# Patient Record
Sex: Female | Born: 1975 | Race: White | Hispanic: No | Marital: Married | Smoking: Never smoker
Health system: Southern US, Community
[De-identification: ages and names within clinical notes are randomized; demographics above are authoritative.]

## PROBLEM LIST (undated history)

## (undated) ENCOUNTER — Inpatient Hospital Stay (HOSPITAL_COMMUNITY): Payer: Self-pay

## (undated) DIAGNOSIS — J45909 Unspecified asthma, uncomplicated: Secondary | ICD-10-CM

## (undated) DIAGNOSIS — Z8619 Personal history of other infectious and parasitic diseases: Secondary | ICD-10-CM

## (undated) DIAGNOSIS — IMO0002 Reserved for concepts with insufficient information to code with codable children: Secondary | ICD-10-CM

## (undated) DIAGNOSIS — R87619 Unspecified abnormal cytological findings in specimens from cervix uteri: Secondary | ICD-10-CM

## (undated) HISTORY — DX: Reserved for concepts with insufficient information to code with codable children: IMO0002

## (undated) HISTORY — DX: Unspecified abnormal cytological findings in specimens from cervix uteri: R87.619

## (undated) HISTORY — PX: DILATION AND CURETTAGE OF UTERUS: SHX78

## (undated) HISTORY — DX: Personal history of other infectious and parasitic diseases: Z86.19

## (undated) HISTORY — DX: Unspecified asthma, uncomplicated: J45.909

## (undated) HISTORY — PX: COLPOSCOPY: SHX161

---

## 2008-09-19 ENCOUNTER — Ambulatory Visit: Payer: Self-pay | Admitting: Gastroenterology

## 2008-09-19 LAB — CONVERTED CEMR LAB
Calcium: 9.5 mg/dL (ref 8.4–10.5)
Creatinine, Ser: 0.7 mg/dL (ref 0.4–1.2)
Sodium: 141 meq/L (ref 135–145)

## 2008-10-07 ENCOUNTER — Ambulatory Visit: Payer: Self-pay | Admitting: Internal Medicine

## 2009-01-18 ENCOUNTER — Telehealth: Payer: Self-pay | Admitting: Internal Medicine

## 2009-12-06 ENCOUNTER — Inpatient Hospital Stay (HOSPITAL_COMMUNITY): Admission: AD | Admit: 2009-12-06 | Discharge: 2009-12-06 | Payer: Self-pay | Admitting: Obstetrics and Gynecology

## 2009-12-15 ENCOUNTER — Inpatient Hospital Stay (HOSPITAL_COMMUNITY): Admission: AD | Admit: 2009-12-15 | Discharge: 2009-12-18 | Payer: Self-pay | Admitting: Obstetrics & Gynecology

## 2010-06-21 LAB — CBC
HCT: 26.4 % — ABNORMAL LOW (ref 36.0–46.0)
MCH: 29.3 pg (ref 26.0–34.0)
MCH: 29.7 pg (ref 26.0–34.0)
MCHC: 33.4 g/dL (ref 30.0–36.0)
MCHC: 33.5 g/dL (ref 30.0–36.0)
MCV: 87.5 fL (ref 78.0–100.0)
MCV: 89 fL (ref 78.0–100.0)
Platelets: 149 10*3/uL — ABNORMAL LOW (ref 150–400)
RBC: 3.59 MIL/uL — ABNORMAL LOW (ref 3.87–5.11)
RDW: 13 % (ref 11.5–15.5)
RDW: 13.3 % (ref 11.5–15.5)
WBC: 9.2 10*3/uL (ref 4.0–10.5)

## 2010-08-31 ENCOUNTER — Ambulatory Visit (INDEPENDENT_AMBULATORY_CARE_PROVIDER_SITE_OTHER): Payer: 59 | Admitting: Internal Medicine

## 2010-08-31 ENCOUNTER — Encounter: Payer: Self-pay | Admitting: Internal Medicine

## 2010-08-31 VITALS — BP 122/78 | HR 79 | Temp 98.3°F | Wt 155.0 lb

## 2010-08-31 DIAGNOSIS — J069 Acute upper respiratory infection, unspecified: Secondary | ICD-10-CM

## 2010-09-03 NOTE — Progress Notes (Signed)
  Subjective:    Patient ID: Tiffany Henderson, female    DOB: 03/04/1976, 35 y.o.   MRN: [id number]  HPI Ms. Edberg presents with a h/o URI symptoms: cough, chest congestion, mild sore throat. She denis any hearing loss, otalgia, fevers or rigors, N/V/D. She has not tried any otc medications. She has had no sick contacts no travel history.    Past Medical History: chicken pox as a child  Past Surgical History: D&C '04  No pregnancy  Family History: father- '59: back problems mother -'66: breast cancer '04 - survivor, HTN Neg- colon cancer, CAD  Social History: [employer] college married '00 - 4 years/divorced; married  '10 no children  work: Equities trader no abuse history.        Review of Systems Review of Systems  Constitutional:  Negative for fever, chills, activity change and unexpected weight change.  HENT:  Negative for hearing loss, ear pain, congestion, neck stiffness. Positive for postnasal drip.   Eyes: Negative for pain, discharge and visual disturbance.  Respiratory: Negative for chest tightness and wheezing.   Cardiovascular: Negative for chest pain and palpitations.      No decreased exercise tolerance Gastrointestinal: No change in bowel habit. No bloating or gas. No reflux or indigestion Genitourinary: Negative for urgency, frequency, flank pain and difficulty urinating.  Musculoskeletal: Negative for myalgias, back pain, arthralgias and gait problem.  Neurological: Negative for dizziness, tremors, weakness and headaches.  Hematological: Negative for adenopathy.  Psychiatric/Behavioral: Negative for behavioral problems and dysphoric mood.       Objective:   Physical Exam Vitals reviewed. HEENT - Accord/AT, EACs/TMs normal without erythema, bulging or loss of landmarks. Neck- supple Chest - CTAP COR- RRR       Assessment & Plan:  URI - patient with symptoms c/w viral URI with no evidence of acute bacterial infection.  Plan -  no lab indicated.           Supportive care with OTC anti-tussive, APAP, hydrate, rest.

## 2010-09-04 ENCOUNTER — Telehealth: Payer: Self-pay | Admitting: *Deleted

## 2010-09-04 NOTE — Telephone Encounter (Signed)
Fever?, swollen glands? Offer rapid strep without OV for morning. If need be I can take a quick look.

## 2010-09-04 NOTE — Telephone Encounter (Signed)
Patient requesting rx for zpak. She continues to have very raw sore throat.

## 2010-09-04 NOTE — Telephone Encounter (Signed)
Left detailed VM for patient. Advised her to call and ask to speak with Ami or myself for quick strep screen.

## 2010-09-05 NOTE — Telephone Encounter (Signed)
Called pt and she states her symptoms are much better today. Will call prn

## 2011-01-03 ENCOUNTER — Telehealth: Payer: Self-pay | Admitting: *Deleted

## 2011-01-03 ENCOUNTER — Encounter: Payer: Self-pay | Admitting: Internal Medicine

## 2011-01-03 NOTE — Telephone Encounter (Signed)
Spoke with patient - she c/o palpitations, "racing" heart & slight SOB off and on x 2 days. Episodes last only a few seconds. She is taking no medication, no supplements etc. No CP. Scheduled for OV tomorrow AM, MD aware. Pt advised to go to ER w/severe symptoms.

## 2011-01-04 ENCOUNTER — Ambulatory Visit (INDEPENDENT_AMBULATORY_CARE_PROVIDER_SITE_OTHER): Payer: 59 | Admitting: Internal Medicine

## 2011-01-04 DIAGNOSIS — R079 Chest pain, unspecified: Secondary | ICD-10-CM

## 2011-01-04 DIAGNOSIS — R51 Headache: Secondary | ICD-10-CM

## 2011-01-04 DIAGNOSIS — R002 Palpitations: Secondary | ICD-10-CM

## 2011-01-06 DIAGNOSIS — R002 Palpitations: Secondary | ICD-10-CM | POA: Insufficient documentation

## 2011-01-06 DIAGNOSIS — R519 Headache, unspecified: Secondary | ICD-10-CM | POA: Insufficient documentation

## 2011-01-06 DIAGNOSIS — R51 Headache: Secondary | ICD-10-CM | POA: Insufficient documentation

## 2011-01-06 NOTE — Progress Notes (Signed)
  Subjective:    Patient ID: Tiffany Henderson, female    DOB: 15-Nov-1975, 35 y.o.   MRN: [id number]  HPI Ms. Peak presents for evaluation of palpitations. She reports that she will have episodes of "heart racing' stating 3 days ago. These episodes began spontaneously, are not associated with activity or exertion and are associated with only minimal symptoms of slight light-headedness but no syncope or near syncope, no chest pain, no SOB, no change in vision. She reports no change in caffeine intake which is two sodas a day. No other identified precipitating factors.   She does have chronic headaches - averaging at least two per week. She will have photophobia, mild nausea. She denies any associated focal neurologic symptoms. She did get relief with excedrin Headache formula but has not tried any other headache medications. She gets no relief from NSAIDs. Duration can be up to one day.   I have reviewed the patient's medical history in detail and updated the computerized patient record..   Review of Systems Constitutional:  Negative for fever, chills, activity change and unexpected weight change.  HEENT:  Negative for hearing loss, ear pain, congestion, neck stiffness and postnasal drip. Negative for sore throat or swallowing problems. Negative for dental complaints.   Eyes: Negative for vision loss or change in visual acuity.  Respiratory: Negative for chest tightness and wheezing. Negative for DOE.   Cardiovascular: Negative for chest pain or palpitations. No decreased exercise tolerance Gastrointestinal: No change in bowel habit. No bloating or gas. No reflux or indigestion Genitourinary: Negative for urgency, frequency, flank pain and difficulty urinating.  Musculoskeletal: Negative for myalgias, back pain, arthralgias and gait problem.  Neurological: Negative for dizziness, tremors, weakness and headaches.  Hematological: Negative for adenopathy.  Psychiatric/Behavioral: Negative for  behavioral problems and dysphoric mood.       Objective:   Physical Exam Vitals reviewed and normal.  Gen'l - WNWD white woman in no distress HEENT - C&S clear, PERRLA Neck - supple Pulm - normal respirations Cor - 2+ radial pulse, RRR no murmurs, rubs or gallops appreciated in both sitting and supine position.  12 lead EKG sinus arrythmia with normal axis and intervals with no ischemic changes (asymptomatic at time of EKG)       Assessment & Plan:

## 2011-01-06 NOTE — Assessment & Plan Note (Signed)
Patient reports chronic headaches - two or more per week that can last all day, have light sensitivity associated with events and require sleep to resolve. No associated neurologic symptoms. Distribution is frontal-parietal. Suspect atypical migraine, tension headache less likely with lack of response to NSAIDs.  Plan - trial of Maxalt MLT           For persistent symptoms may benefit from Headache evaluation by Dr. Clarisse Gouge.

## 2011-01-06 NOTE — Assessment & Plan Note (Signed)
Patient with a barrage of palpitation episodes over the past three days. Exam and EKG normal. Suspect PSVT.  Plan - patient to check pulse at next episode to determine regular vs irregular rhythm and to estimate rate           Instructed in Valsalva maneuver and carotid massage.            Reassured of negative exam and low risk of dangerous arrythmias based on history and exam.           For persistent symptoms will consider Holter monitoring.

## 2011-04-09 NOTE — L&D Delivery Note (Signed)
Delivery Note At 1:03 PM a viable and healthy female was delivered via Vaginal, Spontaneous Delivery (Presentation: Left Occiput Anterior).  APGAR: 9, 9; weight Pending.   Placenta status: Intact, Spontaneous.  Cord: 3 vessels with a loose shoulder cord that was reduced after delivery  Anesthesia: Epidural, 10cc 1% lidocaine Episiotomy: None Lacerations: 2nd degree Suture Repair: 3.0 vicryl Est. Blood Loss (mL): 300cc  Mom to postpartum.  Baby to nursery-stable.  Brilynn Biasi H. 01/16/2012, 1:21 PM

## 2011-06-05 LAB — OB RESULTS CONSOLE ABO/RH: RH Type: POSITIVE

## 2011-06-05 LAB — OB RESULTS CONSOLE GC/CHLAMYDIA: Chlamydia: NEGATIVE

## 2011-06-05 LAB — OB RESULTS CONSOLE ANTIBODY SCREEN: Antibody Screen: NEGATIVE

## 2011-06-05 LAB — OB RESULTS CONSOLE HEPATITIS B SURFACE ANTIGEN: Hepatitis B Surface Ag: NEGATIVE

## 2011-12-30 ENCOUNTER — Encounter (HOSPITAL_COMMUNITY): Payer: Self-pay | Admitting: *Deleted

## 2011-12-30 ENCOUNTER — Inpatient Hospital Stay (HOSPITAL_COMMUNITY)
Admission: AD | Admit: 2011-12-30 | Discharge: 2011-12-30 | Disposition: A | Discharge status: Home / Self Care | Payer: 59 | Source: Ambulatory Visit | Attending: Obstetrics and Gynecology | Admitting: Obstetrics and Gynecology

## 2011-12-30 DIAGNOSIS — O479 False labor, unspecified: Secondary | ICD-10-CM | POA: Insufficient documentation

## 2011-12-30 NOTE — MAU Note (Signed)
Was 3 cm on Fri.

## 2011-12-30 NOTE — MAU Note (Signed)
Hx of braxton hicks past couple wks.  Started cramping more this after noon, now every 8-9 min, low abd and low back shooting into pelvis.

## 2011-12-30 NOTE — MAU Note (Signed)
Pt presents for contractions that started after lunch and are approx. 8-88minutes.  Denies any LOF or bleeding.  Reports good fetal movement.

## 2012-01-13 ENCOUNTER — Encounter (HOSPITAL_COMMUNITY): Payer: Self-pay | Admitting: *Deleted

## 2012-01-13 ENCOUNTER — Telehealth (HOSPITAL_COMMUNITY): Payer: Self-pay | Admitting: *Deleted

## 2012-01-13 NOTE — Telephone Encounter (Signed)
Preadmission screen  

## 2012-01-16 ENCOUNTER — Inpatient Hospital Stay (HOSPITAL_COMMUNITY): Payer: 59 | Admitting: Anesthesiology

## 2012-01-16 ENCOUNTER — Encounter (HOSPITAL_COMMUNITY): Payer: Self-pay | Admitting: Anesthesiology

## 2012-01-16 ENCOUNTER — Encounter (HOSPITAL_COMMUNITY): Payer: Self-pay

## 2012-01-16 ENCOUNTER — Inpatient Hospital Stay (HOSPITAL_COMMUNITY)
Admission: RE | Admit: 2012-01-16 | Discharge: 2012-01-18 | DRG: 775 | Disposition: A | Discharge status: Home / Self Care | Payer: 59 | Source: Ambulatory Visit | Attending: Obstetrics and Gynecology | Admitting: Obstetrics and Gynecology

## 2012-01-16 LAB — CBC
HCT: 30 % — ABNORMAL LOW (ref 36.0–46.0)
Hemoglobin: 9.9 g/dL — ABNORMAL LOW (ref 12.0–15.0)
WBC: 11 10*3/uL — ABNORMAL HIGH (ref 4.0–10.5)

## 2012-01-16 LAB — TYPE AND SCREEN
ABO/RH(D): B POS
Antibody Screen: NEGATIVE

## 2012-01-16 LAB — ABO/RH: ABO/RH(D): B POS

## 2012-01-16 MED ORDER — DIPHENHYDRAMINE HCL 50 MG/ML IJ SOLN: 12.5000 mg | INTRAMUSCULAR | Status: DC | PRN

## 2012-01-16 MED ORDER — PRENATAL MULTIVITAMIN CH
1.0000 | ORAL_TABLET | Freq: Every day | ORAL | Status: DC
  Administered 2012-01-17: 1 via ORAL
  Filled 2012-01-16: qty 1

## 2012-01-16 MED ORDER — LACTATED RINGERS IV SOLN: 500.0000 mL | Freq: Once | INTRAVENOUS | Status: DC

## 2012-01-16 MED ORDER — IBUPROFEN 600 MG PO TABS
600.0000 mg | ORAL_TABLET | Freq: Four times a day (QID) | ORAL | Status: DC | PRN
  Administered 2012-01-16: 600 mg via ORAL
  Filled 2012-01-16: qty 1

## 2012-01-16 MED ORDER — FENTANYL 2.5 MCG/ML BUPIVACAINE 1/10 % EPIDURAL INFUSION (WH - ANES)
INTRAMUSCULAR | Status: DC | PRN
  Administered 2012-01-16: 14 mL/h via EPIDURAL

## 2012-01-16 MED ORDER — SODIUM CHLORIDE 0.9 % IV SOLN: 250.0000 mL | INTRAVENOUS | Status: DC | PRN

## 2012-01-16 MED ORDER — SODIUM CHLORIDE 0.9 % IJ SOLN: 3.0000 mL | Freq: Two times a day (BID) | INTRAMUSCULAR | Status: DC

## 2012-01-16 MED ORDER — IBUPROFEN 600 MG PO TABS
600.0000 mg | ORAL_TABLET | Freq: Four times a day (QID) | ORAL | Status: DC
  Administered 2012-01-16 – 2012-01-18 (×6): 600 mg via ORAL
  Filled 2012-01-16 (×6): qty 1

## 2012-01-16 MED ORDER — ONDANSETRON HCL 4 MG/2ML IJ SOLN: 4.0000 mg | INTRAMUSCULAR | Status: DC | PRN

## 2012-01-16 MED ORDER — DIBUCAINE 1 % RE OINT
1.0000 "application " | TOPICAL_OINTMENT | RECTAL | Status: DC | PRN
  Filled 2012-01-16: qty 28

## 2012-01-16 MED ORDER — OXYCODONE-ACETAMINOPHEN 5-325 MG PO TABS: 1.0000 | ORAL_TABLET | ORAL | Status: DC | PRN

## 2012-01-16 MED ORDER — CITRIC ACID-SODIUM CITRATE 334-500 MG/5ML PO SOLN: 30.0000 mL | ORAL | Status: DC | PRN

## 2012-01-16 MED ORDER — INFLUENZA VIRUS VACC SPLIT PF IM SUSP: 0.5000 mL | INTRAMUSCULAR | Status: DC

## 2012-01-16 MED ORDER — EPHEDRINE 5 MG/ML INJ
10.0000 mg | INTRAVENOUS | Status: DC | PRN
  Filled 2012-01-16: qty 4

## 2012-01-16 MED ORDER — OXYTOCIN 40 UNITS IN LACTATED RINGERS INFUSION - SIMPLE MED: 62.5000 mL/h | Freq: Once | INTRAVENOUS | Status: DC

## 2012-01-16 MED ORDER — OXYTOCIN BOLUS FROM INFUSION
500.0000 mL | Freq: Once | INTRAVENOUS | Status: DC
  Filled 2012-01-16: qty 500

## 2012-01-16 MED ORDER — LANOLIN HYDROUS EX OINT: TOPICAL_OINTMENT | CUTANEOUS | Status: DC | PRN

## 2012-01-16 MED ORDER — LIDOCAINE HCL (PF) 1 % IJ SOLN
30.0000 mL | INTRAMUSCULAR | Status: DC | PRN
  Administered 2012-01-16: 30 mL via SUBCUTANEOUS
  Filled 2012-01-16: qty 30

## 2012-01-16 MED ORDER — TERBUTALINE SULFATE 1 MG/ML IJ SOLN: 0.2500 mg | Freq: Once | INTRAMUSCULAR | Status: DC | PRN

## 2012-01-16 MED ORDER — OXYTOCIN 40 UNITS IN LACTATED RINGERS INFUSION - SIMPLE MED
1.0000 m[IU]/min | INTRAVENOUS | Status: DC
  Administered 2012-01-16: 4 m[IU]/min via INTRAVENOUS
  Administered 2012-01-16: 300 m[IU]/min via INTRAVENOUS
  Administered 2012-01-16: 2 m[IU]/min via INTRAVENOUS
  Filled 2012-01-16: qty 1000

## 2012-01-16 MED ORDER — ONDANSETRON HCL 4 MG PO TABS: 4.0000 mg | ORAL_TABLET | ORAL | Status: DC | PRN

## 2012-01-16 MED ORDER — ZOLPIDEM TARTRATE 5 MG PO TABS: 5.0000 mg | ORAL_TABLET | Freq: Every evening | ORAL | Status: DC | PRN

## 2012-01-16 MED ORDER — TETANUS-DIPHTH-ACELL PERTUSSIS 5-2.5-18.5 LF-MCG/0.5 IM SUSP: 0.5000 mL | Freq: Once | INTRAMUSCULAR | Status: DC

## 2012-01-16 MED ORDER — SODIUM CHLORIDE 0.9 % IJ SOLN: 3.0000 mL | INTRAMUSCULAR | Status: DC | PRN

## 2012-01-16 MED ORDER — LACTATED RINGERS IV SOLN
500.0000 mL | INTRAVENOUS | Status: DC | PRN
  Administered 2012-01-16: 500 mL via INTRAVENOUS

## 2012-01-16 MED ORDER — LACTATED RINGERS IV SOLN
INTRAVENOUS | Status: DC
  Administered 2012-01-16: 125 mL/h via INTRAVENOUS

## 2012-01-16 MED ORDER — OXYCODONE-ACETAMINOPHEN 5-325 MG PO TABS
1.0000 | ORAL_TABLET | ORAL | Status: DC | PRN
  Administered 2012-01-16 – 2012-01-18 (×6): 1 via ORAL
  Filled 2012-01-16 (×6): qty 1

## 2012-01-16 MED ORDER — ACETAMINOPHEN 325 MG PO TABS: 650.0000 mg | ORAL_TABLET | ORAL | Status: DC | PRN

## 2012-01-16 MED ORDER — WITCH HAZEL-GLYCERIN EX PADS: 1.0000 "application " | MEDICATED_PAD | CUTANEOUS | Status: DC | PRN

## 2012-01-16 MED ORDER — ONDANSETRON HCL 4 MG/2ML IJ SOLN: 4.0000 mg | Freq: Four times a day (QID) | INTRAMUSCULAR | Status: DC | PRN

## 2012-01-16 MED ORDER — BENZOCAINE-MENTHOL 20-0.5 % EX AERO
1.0000 "application " | INHALATION_SPRAY | CUTANEOUS | Status: DC | PRN
  Administered 2012-01-16: 1 via TOPICAL
  Filled 2012-01-16: qty 56

## 2012-01-16 MED ORDER — SENNOSIDES-DOCUSATE SODIUM 8.6-50 MG PO TABS
2.0000 | ORAL_TABLET | Freq: Every day | ORAL | Status: DC
  Administered 2012-01-16: 2 via ORAL

## 2012-01-16 MED ORDER — EPHEDRINE 5 MG/ML INJ: 10.0000 mg | INTRAVENOUS | Status: DC | PRN

## 2012-01-16 MED ORDER — DIPHENHYDRAMINE HCL 25 MG PO CAPS: 25.0000 mg | ORAL_CAPSULE | Freq: Four times a day (QID) | ORAL | Status: DC | PRN

## 2012-01-16 MED ORDER — LIDOCAINE HCL (PF) 1 % IJ SOLN
INTRAMUSCULAR | Status: DC | PRN
  Administered 2012-01-16 (×2): 9 mL

## 2012-01-16 MED ORDER — BUTORPHANOL TARTRATE 1 MG/ML IJ SOLN
1.0000 mg | INTRAMUSCULAR | Status: DC | PRN
Start: 2012-01-16 — End: 2012-01-16

## 2012-01-16 MED ORDER — PHENYLEPHRINE 40 MCG/ML (10ML) SYRINGE FOR IV PUSH (FOR BLOOD PRESSURE SUPPORT)
80.0000 ug | PREFILLED_SYRINGE | INTRAVENOUS | Status: DC | PRN
  Filled 2012-01-16: qty 5

## 2012-01-16 MED ORDER — PHENYLEPHRINE 40 MCG/ML (10ML) SYRINGE FOR IV PUSH (FOR BLOOD PRESSURE SUPPORT): 80.0000 ug | PREFILLED_SYRINGE | INTRAVENOUS | Status: DC | PRN

## 2012-01-16 MED ORDER — SIMETHICONE 80 MG PO CHEW: 80.0000 mg | CHEWABLE_TABLET | ORAL | Status: DC | PRN

## 2012-01-16 MED ORDER — FENTANYL 2.5 MCG/ML BUPIVACAINE 1/10 % EPIDURAL INFUSION (WH - ANES)
14.0000 mL/h | INTRAMUSCULAR | Status: DC
  Filled 2012-01-16: qty 125

## 2012-01-16 NOTE — Progress Notes (Signed)
Patient ID: Tiffany Henderson, female   DOB: 07/12/1975, 36 y.o.   MRN: [id number]  S: Comfortable O: AFVSS Dilation: 4 Effacement (%): 80 Cervical Position: Middle Station: -2 Presentation: Vertex Exam by:: Dr Tenny Craw [id number] reactive with accels no decels toco Q2-4  AROM clear fluid  A/P 1) COnt pit 2) Epidural

## 2012-01-16 NOTE — H&P (Signed)
Luetta D Urquiza is a 36 y.o. female presenting for IOL 35yo G3P1011 @ 39+1 presents for IOL.  Her pregnancy has been uncomplicated to this point. History OB History    Grav Para Term Preterm Abortions TAB SAB Ect Mult Living   2 1 1  0 1 0 1 0 0 1     Past Medical History  Diagnosis Date  . History of shingles   . Abnormal Pap smear    Past Surgical History  Procedure Date  . Dilation and curettage of uterus   . Colposcopy    Family History: family history includes Birth defects in her cousin; Cancer in her mother; and Hypertension in her father, maternal grandmother, and mother. Social History:  reports that she has never smoked. She does not have any smokeless tobacco history on file. She reports that she does not drink alcohol or use illicit drugs.   Prenatal Transfer Tool  Maternal Diabetes: No Genetic Screening: Normal Maternal Ultrasounds/Referrals: Normal Fetal Ultrasounds or other Referrals:  None Maternal Substance Abuse:  No Significant Maternal Medications:  None Significant Maternal Lab Results:  None Other Comments:  None  ROS: As above  Dilation: 3 Effacement (%): 50 Station: -2 Exam by:: dherr rn Blood pressure 118/71, pulse 73, temperature 98.5 F (36.9 C), temperature source Oral, resp. rate 20, unknown if currently breastfeeding. Exam Physical Exam  Prenatal labs: ABO, Rh: --/--/B POS (10/10 0800) Antibody: NEG (10/10 0800) Rubella: Immune (02/27 0000) RPR: Nonreactive (02/27 0000)  HBsAg: Negative (02/27 0000)  HIV: Non-reactive (02/27 0000)  GBS: Negative (09/20 0000)   Assessment/Plan: 1) Admit 2) Pitocin 3) Epidural on request 4) AROM when able  Klover Priestly H. 01/16/2012, 10:38 AM

## 2012-01-16 NOTE — Anesthesia Preprocedure Evaluation (Signed)
Anesthesia Evaluation  Patient identified by MRN, date of birth, ID band Patient awake    Reviewed: Allergy & Precautions, H&P , NPO status , Patient's Chart, lab work & pertinent test results  Airway Mallampati: I TM Distance: >3 FB Neck ROM: full    Dental No notable dental hx.    Pulmonary neg pulmonary ROS,  breath sounds clear to auscultation  Pulmonary exam normal       Cardiovascular negative cardio ROS      Neuro/Psych negative psych ROS   GI/Hepatic negative GI ROS, Neg liver ROS,   Endo/Other  negative endocrine ROS  Renal/GU negative Renal ROS  negative genitourinary   Musculoskeletal negative musculoskeletal ROS (+)   Abdominal Normal abdominal exam  (+)   Peds negative pediatric ROS (+)  Hematology negative hematology ROS (+)   Anesthesia Other Findings   Reproductive/Obstetrics (+) Pregnancy                           Anesthesia Physical Anesthesia Plan  ASA: II  Anesthesia Plan: Epidural   Post-op Pain Management:    Induction:   Airway Management Planned:   Additional Equipment:   Intra-op Plan:   Post-operative Plan:   Informed Consent: I have reviewed the patients History and Physical, chart, labs and discussed the procedure including the risks, benefits and alternatives for the proposed anesthesia with the patient or authorized representative who has indicated his/her understanding and acceptance.     Plan Discussed with:   Anesthesia Plan Comments:         Anesthesia Quick Evaluation

## 2012-01-16 NOTE — Anesthesia Procedure Notes (Signed)
Epidural Patient location during procedure: OB Start time: 01/16/2012 11:45 AM End time: 01/16/2012 11:49 AM  Staffing Anesthesiologist: Sandrea Hughs Performed by: anesthesiologist   Preanesthetic Checklist Completed: patient identified, site marked, surgical consent, pre-op evaluation, timeout performed, IV checked, risks and benefits discussed and monitors and equipment checked  Epidural Patient position: sitting Prep: site prepped and draped and DuraPrep Patient monitoring: continuous pulse ox and blood pressure Approach: midline Injection technique: LOR air  Needle:  Needle type: Tuohy  Needle gauge: 17 G Needle length: 9 cm and 9 Needle insertion depth: 5 cm cm Catheter type: closed end flexible Catheter size: 19 Gauge Catheter at skin depth: 10 cm Test dose: negative and Other  Assessment Sensory level: T8 Events: blood not aspirated, injection not painful, no injection resistance, negative IV test and no paresthesia  Additional Notes Reason for block:procedure for pain

## 2012-01-17 LAB — CBC
HCT: 26.8 % — ABNORMAL LOW (ref 36.0–46.0)
MCHC: 32.8 g/dL (ref 30.0–36.0)
Platelets: 162 10*3/uL (ref 150–400)
RDW: 12.9 % (ref 11.5–15.5)
WBC: 10.6 10*3/uL — ABNORMAL HIGH (ref 4.0–10.5)

## 2012-01-17 NOTE — Anesthesia Postprocedure Evaluation (Signed)
  Anesthesia Post-op Note  Patient: Tiffany Henderson  Procedure(s) Performed: * No procedures listed *  Patient Location: Mother/Baby  Anesthesia Type: Epidural  Level of Consciousness: awake, alert  and oriented  Airway and Oxygen Therapy: Patient Spontanous Breathing  Post-op Pain: none  Post-op Assessment: Post-op Vital signs reviewed and Patient's Cardiovascular Status Stable  Post-op Vital Signs: Reviewed and stable  Complications: No apparent anesthesia complications

## 2012-01-17 NOTE — Progress Notes (Signed)
UR chart review completed.  

## 2012-01-17 NOTE — Progress Notes (Signed)
Patient is eating, ambulating, voiding.  Pain control is good. Appropriate lochia.  No complaints.  Filed Vitals:   01/16/12 1822 01/16/12 2225 01/17/12 0545 01/17/12 1401  BP: 108/65 99/62 108/74 104/67  Pulse: 67 67 61 66  Temp: 98.5 F (36.9 C) 98.3 F (36.8 C) 97.4 F (36.3 C) 97.5 F (36.4 C)  TempSrc: Oral Oral Oral   Resp: 20 18 18 18   Height:      Weight:      SpO2: 97%       Fundus firm No CT  Lab Results  Component Value Date   WBC 10.6* 01/17/2012   HGB 8.8* 01/17/2012   HCT 26.8* 01/17/2012   MCV 89.0 01/17/2012   PLT 162 01/17/2012    --/--/B POS (10/10 0850)/RI  A/P Post partum day 1  Routine care.  Expect d/c 10/12.   Baby with hypospadia - to see urology for eval.  Claiborne Billings, Luther Parody

## 2012-01-18 MED ORDER — IBUPROFEN 600 MG PO TABS: 600.0000 mg | ORAL_TABLET | Freq: Four times a day (QID) | ORAL | Status: DC

## 2012-01-18 NOTE — Discharge Summary (Signed)
Obstetric Discharge Summary Reason for Admission: induction of labor Prenatal Procedures: none Intrapartum Procedures: spontaneous vaginal delivery Postpartum Procedures: none Complications-Operative and Postpartum: 2nd degree perineal laceration Hemoglobin  Date Value Range Status  01/17/2012 8.8* 12.0 - 15.0 g/dL Final     HCT  Date Value Range Status  01/17/2012 26.8* 36.0 - 46.0 % Final    Physical Exam:  General: alert and cooperative Lochia: appropriate Uterine Fundus: firm DVT Evaluation: No evidence of DVT seen on physical exam.  Discharge Diagnoses: Term Pregnancy-delivered  Discharge Information: Date: 01/18/2012 Activity: pelvic rest Diet: routine Medications: PNV, Ibuprofen, Colace and Iron Condition: stable Instructions: refer to practice specific booklet Discharge to: home Follow-up Information    Follow up with Almon Hercules., MD. In 4 weeks.   Contact information:   [address] [place] [address] [phone]          Newborn Data: Live born female  Birth Weight: 7 lb 14 oz (3572 g) APGAR: 9, 9  Home with mother.  Philip Aspen 01/18/2012, 8:31 AM

## 2012-08-24 ENCOUNTER — Encounter: Payer: Self-pay | Admitting: Internal Medicine

## 2012-08-24 ENCOUNTER — Ambulatory Visit (INDEPENDENT_AMBULATORY_CARE_PROVIDER_SITE_OTHER): Payer: 59 | Admitting: Internal Medicine

## 2012-08-24 VITALS — BP 118/82 | HR 78 | Temp 97.8°F

## 2012-08-24 DIAGNOSIS — R300 Dysuria: Secondary | ICD-10-CM

## 2012-08-24 DIAGNOSIS — B49 Unspecified mycosis: Secondary | ICD-10-CM

## 2012-08-24 DIAGNOSIS — T3695XA Adverse effect of unspecified systemic antibiotic, initial encounter: Secondary | ICD-10-CM

## 2012-08-24 DIAGNOSIS — R350 Frequency of micturition: Secondary | ICD-10-CM

## 2012-08-24 DIAGNOSIS — R3915 Urgency of urination: Secondary | ICD-10-CM

## 2012-08-24 LAB — POCT URINALYSIS DIPSTICK
Spec Grav, UA: 1.02
Urobilinogen, UA: 0.2
pH, UA: 6

## 2012-08-24 MED ORDER — CIPROFLOXACIN HCL 500 MG PO TABS: 500.0000 mg | ORAL_TABLET | Freq: Two times a day (BID) | ORAL | Status: DC

## 2012-08-24 MED ORDER — FLUCONAZOLE 150 MG PO TABS: 150.0000 mg | ORAL_TABLET | Freq: Once | ORAL | Status: DC

## 2012-08-24 NOTE — Progress Notes (Signed)
HPI  Pt presents to the clinic today with c/o urinary urgency, frequency and dysuria x 2 days.She has had some associated nausea and lower back pain.  She denies fever, chills, vomiting, blood in her urine. She has tried to increase her fluid intake which is helping some. She is also taking Azo OTC as well. She denies vaginal discharge or pain. She has had UTI in the past and reports this feels the same. Additionally, if she gets an antibiotic, she will need something for a yeast infection.   Review of Systems  Past Medical History  Diagnosis Date  . History of shingles   . Abnormal Pap smear     Family History  Problem Relation Age of Onset  . Cancer Mother     breast '04 survivor  . Hypertension Mother   . Hypertension Father   . Hypertension Maternal Grandmother   . Birth defects Cousin     Down's    History   Social History  . Marital Status: Married    Spouse Name: N/A    Number of Children: N/A  . Years of Education: N/A   Occupational History  . Not on file.   Social History Main Topics  . Smoking status: Never Smoker   . Smokeless tobacco: Not on file  . Alcohol Use: No  . Drug Use: No  . Sexually Active: Yes    Birth Control/ Protection: None   Other Topics Concern  . Not on file   Social History Narrative   [employer] college   Married '00- 4 years/divorced; married '10   Work Water engineer   No abuse history    No Known Allergies  Constitutional: Denies fever, malaise, fatigue, headache or abrupt weight changes.   GU: Pt reports urgency, frequency and pain with urination. Denies burning sensation, blood in urine, odor or discharge. Skin: Denies redness, rashes, lesions or ulcercations.   No other specific complaints in a complete review of systems (except as listed in HPI above).    Objective:   Physical Exam  BP 118/82  Pulse 78  Temp(Src) 97.8 F (36.6 C) (Oral)  SpO2 95%  LMP 07/30/2012 Wt Readings from Last 3 Encounters:   01/16/12 190 lb (86.183 kg)  12/30/11 190 lb (86.183 kg)  01/04/11 159 lb (72.122 kg)    General: Appears her stated age, well developed, well nourished in NAD. Cardiovascular: Normal rate and rhythm. S1,S2 noted.  No murmur, rubs or gallops noted. No JVD or BLE edema. No carotid bruits noted. Pulmonary/Chest: Normal effort and positive vesicular breath sounds. No respiratory distress. No wheezes, rales or ronchi noted.  Abdomen: Soft and nontender. Normal bowel sounds, no bruits noted. No distention or masses noted. Liver, spleen and kidneys non palpable. Tender to palpation over the bladder area. No CVA tenderness.      Assessment & Plan:   Urinary urgency, frequency and dysuria secondary to UTI:  eRx sent if for Cipro 500 mg BID x 3 days eRx sent in for Pyridium 200 mg TID prn Drink plenty of fluids  Antibiotic induced yeast infection:  eRx for Diflucan 150 mg x 1 RTC as needed or if symptoms persist.

## 2012-08-24 NOTE — Patient Instructions (Signed)
Urinary Tract Infection Urinary tract infections (UTIs) can develop anywhere along your urinary tract. Your urinary tract is your body's drainage system for removing wastes and extra water. Your urinary tract includes two kidneys, two ureters, a bladder, and a urethra. Your kidneys are a pair of bean-shaped organs. Each kidney is about the size of your fist. They are located below your ribs, one on each side of your spine. CAUSES Infections are caused by microbes, which are microscopic organisms, including fungi, viruses, and bacteria. These organisms are so small that they can only be seen through a microscope. Bacteria are the microbes that most commonly cause UTIs. SYMPTOMS  Symptoms of UTIs may vary by age and gender of the patient and by the location of the infection. Symptoms in young women typically include a frequent and intense urge to urinate and a painful, burning feeling in the bladder or urethra during urination. Older women and men are more likely to be tired, shaky, and weak and have muscle aches and abdominal pain. A fever may mean the infection is in your kidneys. Other symptoms of a kidney infection include pain in your back or sides below the ribs, nausea, and vomiting. DIAGNOSIS To diagnose a UTI, your caregiver will ask you about your symptoms. Your caregiver also will ask to provide a urine sample. The urine sample will be tested for bacteria and white blood cells. White blood cells are made by your body to help fight infection. TREATMENT  Typically, UTIs can be treated with medication. Because most UTIs are caused by a bacterial infection, they usually can be treated with the use of antibiotics. The choice of antibiotic and length of treatment depend on your symptoms and the type of bacteria causing your infection. HOME CARE INSTRUCTIONS  If you were prescribed antibiotics, take them exactly as your caregiver instructs you. Finish the medication even if you feel better after you  have only taken some of the medication.  Drink enough water and fluids to keep your urine clear or pale yellow.  Avoid caffeine, tea, and carbonated beverages. They tend to irritate your bladder.  Empty your bladder often. Avoid holding urine for long periods of time.  Empty your bladder before and after sexual intercourse.  After a bowel movement, women should cleanse from front to back. Use each tissue only once. SEEK MEDICAL CARE IF:   You have back pain.  You develop a fever.  Your symptoms do not begin to resolve within 3 days. SEEK IMMEDIATE MEDICAL CARE IF:   You have severe back pain or lower abdominal pain.  You develop chills.  You have nausea or vomiting.  You have continued burning or discomfort with urination. MAKE SURE YOU:   Understand these instructions.  Will watch your condition.  Will get help right away if you are not doing well or get worse. Document Released: 01/02/2005 Document Revised: 09/24/2011 Document Reviewed: 05/03/2011 Exit[employer] Patient Information 2013 [employer].  

## 2012-08-26 ENCOUNTER — Encounter: Payer: Self-pay | Admitting: Internal Medicine

## 2012-12-10 ENCOUNTER — Other Ambulatory Visit: Payer: Self-pay

## 2012-12-10 DIAGNOSIS — Z1231 Encounter for screening mammogram for malignant neoplasm of breast: Secondary | ICD-10-CM

## 2012-12-10 DIAGNOSIS — Z803 Family history of malignant neoplasm of breast: Secondary | ICD-10-CM

## 2013-01-28 ENCOUNTER — Ambulatory Visit
Admission: RE | Admit: 2013-01-28 | Discharge: 2013-01-28 | Disposition: A | Discharge status: Home / Self Care | Payer: 59 | Source: Ambulatory Visit

## 2013-01-28 DIAGNOSIS — Z1231 Encounter for screening mammogram for malignant neoplasm of breast: Secondary | ICD-10-CM

## 2013-01-28 DIAGNOSIS — Z803 Family history of malignant neoplasm of breast: Secondary | ICD-10-CM

## 2013-01-29 ENCOUNTER — Encounter: Payer: Self-pay | Admitting: Internal Medicine

## 2013-02-08 ENCOUNTER — Encounter: Payer: Self-pay | Admitting: Internal Medicine

## 2013-02-08 ENCOUNTER — Ambulatory Visit (INDEPENDENT_AMBULATORY_CARE_PROVIDER_SITE_OTHER): Payer: 59 | Admitting: Internal Medicine

## 2013-02-08 VITALS — BP 108/70 | HR 94 | Temp 98.2°F | Resp 16 | Wt 156.0 lb

## 2013-02-08 DIAGNOSIS — B9689 Other specified bacterial agents as the cause of diseases classified elsewhere: Secondary | ICD-10-CM

## 2013-02-08 DIAGNOSIS — J329 Chronic sinusitis, unspecified: Secondary | ICD-10-CM

## 2013-02-08 DIAGNOSIS — A499 Bacterial infection, unspecified: Secondary | ICD-10-CM

## 2013-02-08 MED ORDER — AZITHROMYCIN 500 MG PO TABS: 500.0000 mg | ORAL_TABLET | Freq: Every day | ORAL | Status: DC

## 2013-02-08 NOTE — Patient Instructions (Signed)

## 2013-02-08 NOTE — Assessment & Plan Note (Signed)
I will treat the infection with a zpak 

## 2013-02-08 NOTE — Progress Notes (Signed)
Subjective:    Patient ID: Tiffany Henderson, female    DOB: 1976-01-15, 37 y.o.   MRN: [id number]  Sinusitis This is a new problem. The current episode started in the past 7 days. The problem has been gradually worsening since onset. The maximum temperature recorded prior to her arrival was 100 - 100.9 F. The fever has been present for 1 to 2 days. Her pain is at a severity of 0/10. She is experiencing no pain. Associated symptoms include chills, congestion, sinus pressure and a sore throat. Pertinent negatives include no coughing, diaphoresis, ear pain, headaches, hoarse voice, neck pain, shortness of breath, sneezing or swollen glands. Past treatments include oral decongestants. The treatment provided mild relief.      Review of Systems  Constitutional: Positive for chills. Negative for fever, diaphoresis, activity change, appetite change, fatigue and unexpected weight change.  HENT: Positive for congestion, postnasal drip, rhinorrhea, sinus pressure and sore throat. Negative for ear pain, facial swelling, hoarse voice, sneezing, trouble swallowing and voice change.   Eyes: Negative.   Respiratory: Negative.  Negative for cough, choking, chest tightness, shortness of breath, wheezing and stridor.   Cardiovascular: Negative.  Negative for chest pain, palpitations and leg swelling.  Gastrointestinal: Negative.  Negative for abdominal pain.  Endocrine: Negative.   Genitourinary: Negative.   Musculoskeletal: Negative.  Negative for neck pain.  Skin: Negative.  Negative for color change, pallor, rash and wound.  Allergic/Immunologic: Negative.   Neurological: Negative.  Negative for dizziness and headaches.  Hematological: Negative.  Negative for adenopathy. Does not bruise/bleed easily.  Psychiatric/Behavioral: Negative.        Objective:   Physical Exam  Vitals reviewed. Constitutional: She is oriented to person, place, and time. She appears well-developed and well-nourished.  Non-toxic  appearance. She does not have a sickly appearance. She does not appear ill. No distress.  HENT:  Right Ear: Hearing, tympanic membrane, external ear and ear canal normal.  Left Ear: Hearing, tympanic membrane, external ear and ear canal normal.  Nose: Mucosal edema and rhinorrhea present. Right sinus exhibits maxillary sinus tenderness. Right sinus exhibits no frontal sinus tenderness. Left sinus exhibits maxillary sinus tenderness. Left sinus exhibits no frontal sinus tenderness.  Mouth/Throat: Oropharynx is clear and moist and mucous membranes are normal. Mucous membranes are not pale, not dry and not cyanotic. No oral lesions. No trismus in the jaw. No uvula swelling. No oropharyngeal exudate, posterior oropharyngeal edema, posterior oropharyngeal erythema or tonsillar abscesses.  Eyes: Conjunctivae are normal. Right eye exhibits no discharge. Left eye exhibits no discharge. No scleral icterus.  Neck: Normal range of motion. Neck supple. No JVD present. No tracheal deviation present. No thyromegaly present.  Cardiovascular: Normal rate, regular rhythm, normal heart sounds and intact distal pulses.  Exam reveals no gallop and no friction rub.   No murmur heard. Pulmonary/Chest: Effort normal and breath sounds normal. No stridor. No respiratory distress. She has no wheezes. She has no rales. She exhibits no tenderness.  Abdominal: Soft. Bowel sounds are normal. She exhibits no distension and no mass. There is no tenderness. There is no rebound and no guarding.  Musculoskeletal: Normal range of motion. She exhibits no edema and no tenderness.  Lymphadenopathy:    She has no cervical adenopathy.  Neurological: She is oriented to person, place, and time.  Skin: Skin is warm and dry. No rash noted. She is not diaphoretic. No erythema. No pallor.  Psychiatric: She has a normal mood and affect. Her behavior is normal. Judgment  and thought content normal.     Lab Results  Component Value Date   WBC  10.6* 01/17/2012   HGB 8.8* 01/17/2012   HCT 26.8* 01/17/2012   PLT 162 01/17/2012   GLUCOSE 93 09/19/2008   NA 141 09/19/2008   K 5.0 09/19/2008   CL 111 09/19/2008   CREATININE 0.7 09/19/2008   BUN 20 09/19/2008   CO2 27 09/19/2008       Assessment & Plan:

## 2013-02-09 ENCOUNTER — Other Ambulatory Visit: Payer: Self-pay | Admitting: Internal Medicine

## 2013-02-09 ENCOUNTER — Encounter: Payer: Self-pay | Admitting: Internal Medicine

## 2013-02-09 DIAGNOSIS — B9689 Other specified bacterial agents as the cause of diseases classified elsewhere: Secondary | ICD-10-CM

## 2013-02-09 MED ORDER — METHYLPREDNISOLONE 4 MG PO KIT: PACK | ORAL | Status: DC

## 2013-04-29 ENCOUNTER — Encounter: Payer: Self-pay | Admitting: Internal Medicine

## 2013-04-29 ENCOUNTER — Other Ambulatory Visit: Payer: Self-pay | Admitting: Internal Medicine

## 2013-04-29 DIAGNOSIS — B9689 Other specified bacterial agents as the cause of diseases classified elsewhere: Secondary | ICD-10-CM

## 2013-04-29 DIAGNOSIS — J329 Chronic sinusitis, unspecified: Principal | ICD-10-CM

## 2013-04-29 MED ORDER — AZITHROMYCIN 500 MG PO TABS: 500.0000 mg | ORAL_TABLET | Freq: Every day | ORAL | Status: DC

## 2014-02-07 ENCOUNTER — Encounter: Payer: Self-pay | Admitting: Internal Medicine

## 2015-02-16 ENCOUNTER — Ambulatory Visit: Payer: 59 | Admitting: Internal Medicine

## 2015-06-26 ENCOUNTER — Ambulatory Visit (INDEPENDENT_AMBULATORY_CARE_PROVIDER_SITE_OTHER): Payer: 59 | Admitting: Internal Medicine

## 2015-06-26 ENCOUNTER — Other Ambulatory Visit: Payer: 59

## 2015-06-26 ENCOUNTER — Encounter: Payer: Self-pay | Admitting: Internal Medicine

## 2015-06-26 VITALS — BP 118/88 | HR 84 | Temp 97.9°F | Resp 16 | Ht 68.0 in | Wt 165.0 lb

## 2015-06-26 DIAGNOSIS — N3001 Acute cystitis with hematuria: Secondary | ICD-10-CM | POA: Insufficient documentation

## 2015-06-26 DIAGNOSIS — N3000 Acute cystitis without hematuria: Secondary | ICD-10-CM | POA: Diagnosis not present

## 2015-06-26 LAB — POCT URINALYSIS DIPSTICK
Bilirubin, UA: NEGATIVE
Glucose, UA: NEGATIVE
Nitrite, UA: POSITIVE
PH UA: 6
PROTEIN UA: 2.1
RBC UA: POSITIVE
Urobilinogen, UA: 2

## 2015-06-26 MED ORDER — SULFAMETHOXAZOLE-TRIMETHOPRIM 800-160 MG PO TABS: 1.0000 | ORAL_TABLET | Freq: Two times a day (BID) | ORAL | Status: AC

## 2015-06-26 MED FILL — SULFAMETHOXAZOLE/TMP DS TAB: 800-160 | 5 days supply | Qty: 10 | Fill #0

## 2015-06-26 NOTE — Progress Notes (Signed)
Pre visit review using our clinic review tool, if applicable. No additional management support is needed unless otherwise documented below in the visit note. 

## 2015-06-26 NOTE — Progress Notes (Signed)
Subjective:  Patient ID: Tiffany Henderson, female    DOB: 03/24/1976  Age: 40 y.o. MRN: [id number]  CC: Urinary Tract Infection   HPI Tiffany Henderson presents for a one-day history of gross hematuria, bladder pain, and dysuria.  Outpatient Prescriptions Prior to Visit  Medication Sig Dispense Refill  . azithromycin (ZITHROMAX) 500 MG tablet Take 1 tablet (500 mg total) by mouth daily. 3 tablet 0  . methylPREDNISolone (MEDROL, PAK,) 4 MG tablet follow package directions 21 tablet 0   No facility-administered medications prior to visit.    ROS Review of Systems  Constitutional: Negative.  Negative for fever, chills, diaphoresis, appetite change and fatigue.  HENT: Negative.   Eyes: Negative.   Respiratory: Negative.  Negative for cough, choking, chest tightness, shortness of breath and stridor.   Cardiovascular: Negative.  Negative for chest pain, palpitations and leg swelling.  Gastrointestinal: Negative.  Negative for nausea, vomiting, abdominal pain, diarrhea and constipation.  Endocrine: Negative.   Genitourinary: Positive for dysuria, urgency and hematuria. Negative for frequency, flank pain, decreased urine volume, vaginal bleeding, vaginal discharge, difficulty urinating and vaginal pain.  Musculoskeletal: Negative.  Negative for myalgias, back pain, arthralgias and neck pain.  Skin: Negative.  Negative for color change, pallor and rash.  Allergic/Immunologic: Negative.   Neurological: Negative.  Negative for dizziness, tremors, weakness, light-headedness and headaches.  Hematological: Negative.  Negative for adenopathy. Does not bruise/bleed easily.  Psychiatric/Behavioral: Negative.     Objective:  BP 118/88 mmHg  Pulse 84  Temp(Src) 97.9 F (36.6 C) (Oral)  Ht  (1.727 m)  Wt 165 lb (74.844 kg)  BMI 25.09 kg/m2  SpO2 95%  LMP 06/22/2015  BP Readings from Last 3 Encounters:  06/26/15 118/88  02/08/13 108/70  08/24/12 118/82    Wt Readings from Last 3  Encounters:  06/26/15 165 lb (74.844 kg)  02/08/13 156 lb (70.761 kg)  01/16/12 190 lb (86.183 kg)    Physical Exam  Constitutional: She is oriented to person, place, and time.  Non-toxic appearance. She does not have a sickly appearance. She does not appear ill. No distress.  HENT:  Mouth/Throat: Oropharynx is clear and moist. No oropharyngeal exudate.  Eyes: Conjunctivae are normal. Right eye exhibits no discharge. Left eye exhibits no discharge. No scleral icterus.  Neck: Normal range of motion. Neck supple. No JVD present. No tracheal deviation present. No thyromegaly present.  Cardiovascular: Normal rate, regular rhythm, normal heart sounds and intact distal pulses.  Exam reveals no gallop and no friction rub.   No murmur heard. Pulmonary/Chest: Effort normal and breath sounds normal. No stridor. No respiratory distress. She has no wheezes. She has no rales. She exhibits no tenderness.  Abdominal: Soft. Bowel sounds are normal. She exhibits no distension and no mass. There is no tenderness. There is no rebound and no guarding.  Musculoskeletal: Normal range of motion. She exhibits no edema or tenderness.  Lymphadenopathy:    She has no cervical adenopathy.  Neurological: She is oriented to person, place, and time.  Skin: Skin is warm and dry. No rash noted. She is not diaphoretic. No erythema. No pallor.  Vitals reviewed.   Lab Results  Component Value Date   WBC 10.6* 01/17/2012   HGB 8.8* 01/17/2012   HCT 26.8* 01/17/2012   PLT 162 01/17/2012   GLUCOSE 93 09/19/2008   NA 141 09/19/2008   K 5.0 09/19/2008   CL 111 09/19/2008   CREATININE 0.7 09/19/2008   BUN 20 09/19/2008  CO2 27 09/19/2008    Mm Digital Screening  01/29/2013  CLINICAL DATA:  Screening. EXAM: DIGITAL SCREENING BILATERAL MAMMOGRAM WITH CAD DIGITAL BREAST TOMOSYNTHESIS Digital breast tomosynthesis images are acquired in two projections. These images are reviewed in combination with the digital mammogram,  confirming the findings below. COMPARISON:  Previous exam(s). ACR Breast Density Category c: The breasts are heterogeneously dense, which may obscure small masses. FINDINGS: There are no findings suspicious for malignancy. Images were processed with CAD. IMPRESSION: No mammographic evidence of malignancy. A result letter of this screening mammogram will be mailed directly to the patient. RECOMMENDATION: Screening mammogram beginning at age 38. (Code:SM-B-40A) BI-RADS CATEGORY  1: Negative Electronically Signed   By: Edwin CapJennifer  Jarosz M.D.   On: 01/29/2013 13:05    Urine dipstick shows positive for nitrites, leukocytes, red blood cells.  Assessment & Plan:   Brycelynn was seen today for urinary tract infection.  Diagnoses and all orders for this visit:  Acute cystitis with hematuria- signs and urinalysis are consistent with acute hemorrhagic cystitis secondary to Escherichia coli, will treat with Bactrim DS. -     sulfamethoxazole-trimethoprim (BACTRIM DS,SEPTRA DS) 800-160 MG tablet; Take 1 tablet by mouth 2 (two) times daily. -     POCT urinalysis dipstick -     Urine Culture; Future   I have discontinued Ms. Norling's methylPREDNISolone and azithromycin. I am also having her start on sulfamethoxazole-trimethoprim.  Meds ordered this encounter  Medications  . sulfamethoxazole-trimethoprim (BACTRIM DS,SEPTRA DS) 800-160 MG tablet    Sig: Take 1 tablet by mouth 2 (two) times daily.    Dispense:  10 tablet    Refill:  1     Follow-up: Return if symptoms worsen or fail to improve.  Sanda Lingerhomas Neiko Trivedi, MD

## 2015-06-26 NOTE — Patient Instructions (Signed)

## 2015-06-28 ENCOUNTER — Encounter: Payer: Self-pay | Admitting: Internal Medicine

## 2015-06-29 ENCOUNTER — Encounter: Payer: Self-pay | Admitting: Internal Medicine

## 2015-06-29 LAB — URINE CULTURE: Colony Count: >=100000

## 2015-08-18 ENCOUNTER — Encounter: Payer: Self-pay | Admitting: Internal Medicine

## 2015-08-18 ENCOUNTER — Telehealth: Payer: Self-pay

## 2015-08-18 MED FILL — SULFAMETHOXAZOLE-TMP DS TAB: 800-160 | 5 days supply | Qty: 10 | Fill #1

## 2015-08-18 NOTE — Telephone Encounter (Signed)
Brittany offered pt an appt at another location as well as Saturday clinic. Pt declined. States she wants script to be sent in. Informed she has not been evaluated in 2 mo and would need to be seen for this as a urine would need to be obtained. Decided to self medicate with leftover meds (4 pills of abx)  

## 2015-08-18 NOTE — Telephone Encounter (Signed)
[place] offered pt an appt at another location as well as Saturday clinic. Pt declined. States she wants script to be sent in. Informed she has not been evaluated in 2 mo and would need to be seen for this as a urine would need to be obtained. Decided to self medicate with leftover meds (4 pills of abx)

## 2016-01-11 ENCOUNTER — Telehealth: Payer: 59 | Admitting: Physician Assistant

## 2016-01-11 ENCOUNTER — Encounter: Payer: Self-pay | Admitting: Internal Medicine

## 2016-01-11 DIAGNOSIS — R079 Chest pain, unspecified: Secondary | ICD-10-CM

## 2016-01-11 DIAGNOSIS — R05 Cough: Secondary | ICD-10-CM

## 2016-01-11 DIAGNOSIS — R059 Cough, unspecified: Secondary | ICD-10-CM

## 2016-01-11 NOTE — Progress Notes (Signed)
Based on what you shared with me it looks like you have a condition that should be evaluated in a face to face office visit. A lot of your symptoms seem consistent with a viral bronchitis, however with worsening symptoms and chest pain/pressure, I feel you should be evaluated in person for a good lung examination to receive the appropriate treatment. Please see your primary provider today or seek care at a local urgent care.  If you are having a true medical emergency please call 911.  If you need an urgent face to face visit, [place] has four urgent care centers for your convenience.  If you need care fast and have a high deductible or no insurance consider:   [email or web address]  [phone]  [address], [address] [place], [place] [address] 8 am to 8 pm Monday-Friday 10 am to 4 pm Saturday-Sunday   The following sites will take your  insurance:    . [facility]  [phone] Get Driving Directions Find a Provider at this Location  [address] [place], [place] [address] . 10 am to 8 pm Monday-Friday . 12 pm to 8 pm Saturday-Sunday   . [facility]  [phone] Get Driving Directions Find a Provider at this Location  [address], [address] [place], [place] [address] . 8 am to 8 pm Monday-Friday . 9 am to 6 pm Saturday . 11 am to 6 pm Sunday   . [facility] [place]  [phone] Get Driving Directions  [address] [place], [place] [address] . 8 am to 8 pm Monday-Friday . 8 am to 4 pm Saturday-Sunday   . [facility] (walk-ins welcome, or call for a scheduled time)  [phone]  Get Driving Directions Find a Provider at this Location  [address] [place], [place] [address] . 8 am to 8:30 pm Monday-Thursday . 8 am to 6 pm Friday . 8 am to 4 pm Saturday-Sunday   Your e-visit answers were reviewed by a board certified  advanced clinical practitioner to complete your personal care plan.  Thank you for using e-Visits.

## 2016-01-16 ENCOUNTER — Ambulatory Visit (INDEPENDENT_AMBULATORY_CARE_PROVIDER_SITE_OTHER): Payer: 59 | Admitting: Internal Medicine

## 2016-01-16 DIAGNOSIS — R059 Cough, unspecified: Secondary | ICD-10-CM

## 2016-01-16 DIAGNOSIS — R05 Cough: Secondary | ICD-10-CM | POA: Insufficient documentation

## 2016-01-16 DIAGNOSIS — R062 Wheezing: Secondary | ICD-10-CM | POA: Diagnosis not present

## 2016-01-16 MED ORDER — AZITHROMYCIN 250 MG PO TABS: ORAL_TABLET | ORAL | 1 refills | Status: DC

## 2016-01-16 MED ORDER — HYDROCODONE-HOMATROPINE 5-1.5 MG/5ML PO SYRP: 5.0000 mL | ORAL_SOLUTION | Freq: Four times a day (QID) | ORAL | 0 refills | Status: AC | PRN

## 2016-01-16 MED ORDER — PREDNISONE 10 MG PO TABS: ORAL_TABLET | ORAL | 0 refills | Status: DC

## 2016-01-16 MED ORDER — ALBUTEROL SULFATE HFA 108 (90 BASE) MCG/ACT IN AERS: 2.0000 | INHALATION_SPRAY | Freq: Four times a day (QID) | RESPIRATORY_TRACT | 2 refills | Status: DC | PRN

## 2016-01-16 MED FILL — predniSONE 10 MG TABS: 10 | 9 days supply | Qty: 18 | Fill #0

## 2016-01-16 MED FILL — AZITHROMYCIN 250 MG TABLET: 250 | 5 days supply | Qty: 6 | Fill #0

## 2016-01-16 NOTE — Patient Instructions (Signed)
You had the steroid shot today  Please take all new medication as prescribed - the antibiotic, cough medicine as needed, the prednisone, and the inhaler as needed  Please continue all other medications as before, and refills have been done if requested.  Please have the pharmacy call with any other refills you may need.  Please keep your appointments with your specialists as you may have planned

## 2016-01-16 NOTE — Progress Notes (Signed)
Pre visit review using our clinic review tool, if applicable. No additional management support is needed unless otherwise documented below in the visit note. 

## 2016-01-20 NOTE — Assessment & Plan Note (Signed)
Mild to mod, for antibx course, cough med prn, to f/u any worsening symptoms or concerns 

## 2016-01-20 NOTE — Assessment & Plan Note (Addendum)
Mild to mod, for depomedrol IM 80, predpac asd, inhaler prn,  to f/u any worsening symptoms or concerns 

## 2016-01-20 NOTE — Progress Notes (Signed)
   Subjective:    Patient ID: Tiffany Henderson, female    DOB: [birth year], 40 y.o.   MRN: [id number]  HPI  .Here with acute onset mild to mod 2-3 days ST, HA, general weakness and malaise, with prod cough greenish sputum, but Pt denies chest pain, increased sob or doe, wheezing, orthopnea, PND, increased LE swelling, palpitations, dizziness or syncope, except for onset mild wheezing/sob since last PM   Pt denies polydipsia, polyuria, Pt denies new neurological symptoms such as new headache, or facial or extremity weakness or numbness Past Medical History:  Diagnosis Date  . Abnormal Pap smear   . History of shingles    Past Surgical History:  Procedure Laterality Date  . COLPOSCOPY    . DILATION AND CURETTAGE OF UTERUS      reports that she has never smoked. She does not have any smokeless tobacco history on file. She reports that she does not drink alcohol or use drugs. family history includes Birth defects in her cousin; Cancer in her mother; Hypertension in her father, maternal grandmother, and mother. No Known Allergies No current outpatient prescriptions on file prior to visit.   No current facility-administered medications on file prior to visit.    Review of Systems   All otherwise neg per pt     Objective:   Physical Exam BP 124/76   Pulse 71   Temp 98.3 F (36.8 C) (Oral)   Resp 20   Wt 163 lb (73.9 kg)   SpO2 97%   BMI 24.78 kg/m  VS noted, mild ill Constitutional: Pt appears in no apparent distress HENT: Head: NCAT.  Right Ear: External ear normal.  Left Ear: External ear normal.  Bilat tm's with mild erythema.  Max sinus areas mild tender.  Pharynx with mild erythema, no exudate Eyes: . Pupils are equal, round, and reactive to light. Conjunctivae and EOM are normal Neck: Normal range of motion. Neck supple.  Cardiovascular: Normal rate and regular rhythm.   Pulmonary/Chest: Effort normal and breath sounds decreased without rales but with mild bilat scattered  wheezing.  Neurological: Pt is alert. Not confused , motor grossly intact Skin: Skin is warm. No rash, no LE edema Psychiatric: Pt behavior is normal. No agitation.      Assessment & Plan:

## 2016-06-07 ENCOUNTER — Telehealth: Payer: 59 | Admitting: Family

## 2016-06-07 DIAGNOSIS — J329 Chronic sinusitis, unspecified: Secondary | ICD-10-CM | POA: Diagnosis not present

## 2016-06-07 DIAGNOSIS — B9689 Other specified bacterial agents as the cause of diseases classified elsewhere: Secondary | ICD-10-CM | POA: Diagnosis not present

## 2016-06-07 MED ORDER — AMOXICILLIN-POT CLAVULANATE 875-125 MG PO TABS: 1.0000 | ORAL_TABLET | Freq: Two times a day (BID) | ORAL | 0 refills | Status: AC

## 2016-06-07 MED FILL — AMOX TR-K CLV 875-125 MG TA: 875-125 | 7 days supply | Qty: 14 | Fill #0

## 2016-06-07 NOTE — Progress Notes (Signed)

## 2016-06-28 ENCOUNTER — Telehealth: Payer: 59 | Admitting: Family

## 2016-06-28 DIAGNOSIS — J329 Chronic sinusitis, unspecified: Secondary | ICD-10-CM | POA: Diagnosis not present

## 2016-06-28 DIAGNOSIS — B9689 Other specified bacterial agents as the cause of diseases classified elsewhere: Secondary | ICD-10-CM

## 2016-06-28 MED ORDER — AMOXICILLIN-POT CLAVULANATE 875-125 MG PO TABS: 1.0000 | ORAL_TABLET | Freq: Two times a day (BID) | ORAL | 0 refills | Status: AC

## 2016-06-28 MED FILL — AMOX TR-K CLV 875-125 MG TA: 875-125 | 7 days supply | Qty: 14 | Fill #0

## 2016-06-28 NOTE — Progress Notes (Signed)

## 2017-01-20 ENCOUNTER — Telehealth: Payer: 59 | Admitting: Family

## 2017-01-20 DIAGNOSIS — N390 Urinary tract infection, site not specified: Secondary | ICD-10-CM | POA: Diagnosis not present

## 2017-01-20 MED ORDER — CEPHALEXIN 500 MG PO CAPS: 500.0000 mg | ORAL_CAPSULE | Freq: Two times a day (BID) | ORAL | 0 refills | Status: DC

## 2017-01-20 MED FILL — CEPHALEXIN 500 MG CAPSULE: 500 | 7 days supply | Qty: 14 | Fill #0

## 2017-01-20 NOTE — Progress Notes (Signed)

## 2017-03-10 ENCOUNTER — Telehealth: Payer: 59 | Admitting: Nurse Practitioner

## 2017-03-10 DIAGNOSIS — H9313 Tinnitus, bilateral: Secondary | ICD-10-CM | POA: Diagnosis not present

## 2017-03-10 DIAGNOSIS — J0100 Acute maxillary sinusitis, unspecified: Secondary | ICD-10-CM

## 2017-03-10 MED ORDER — FLUTICASONE PROPIONATE 50 MCG/ACT NA SUSP: 2.0000 | Freq: Every day | NASAL | 6 refills | Status: DC

## 2017-03-10 MED ORDER — AMOXICILLIN-POT CLAVULANATE 875-125 MG PO TABS: 1.0000 | ORAL_TABLET | Freq: Two times a day (BID) | ORAL | 0 refills | Status: DC

## 2017-03-10 MED FILL — AMOX TR-K CLV 875-125 MG TA: 875-125 | 7 days supply | Qty: 14 | Fill #0

## 2017-03-10 MED FILL — FLUTICASONE PROP 50 MCG SPR: 50 | 30 days supply | Qty: 16 | Fill #0

## 2017-03-10 NOTE — Progress Notes (Signed)
We are sorry that you are not feeling well.  Here is how we plan to help!  Based on what you have shared with me it looks like you have sinusitis.  Sinusitis is inflammation and infection in the sinus cavities of the head.  Based on your presentation I believe you most likely have Acute Bacterial Sinusitis.  This is an infection caused by bacteria and is treated with antibiotics. I have prescribed Augmentin 875mg /125mg  one tablet twice daily with food, for 7 days. You may use an oral decongestant such as Mucinex D or if you have glaucoma or high blood pressure use plain Mucinex. Saline nasal spray help and can safely be used as often as needed for congestion.  If you develop worsening sinus pain, fever or notice severe headache and vision changes, or if symptoms are not better after completion of antibiotic, please schedule an appointment with a health care provider.   * I have also sent in flonase rx ( 2 puff each nostril daily ) that may help with ringing in the ears Sinus infections are not as easily transmitted as other respiratory infection, however we still recommend that you avoid close contact with loved ones, especially the very young and elderly.  Remember to wash your hands thoroughly throughout the day as this is the number one way to prevent the spread of infection!  Home Care:  Only take medications as instructed by your medical team.  Complete the entire course of an antibiotic.  Do not take these medications with alcohol.  A steam or ultrasonic humidifier can help congestion.  You can place a towel over your head and breathe in the steam from hot water coming from a faucet.  Avoid close contacts especially the very young and the elderly.  Cover your mouth when you cough or sneeze.  Always remember to wash your hands.  Get Help Right Away If:  You develop worsening fever or sinus pain.  You develop a severe head ache or visual changes.  Your symptoms persist after you have  completed your treatment plan.  Make sure you  Understand these instructions.  Will watch your condition.  Will get help right away if you are not doing well or get worse.  Your e-visit answers were reviewed by a board certified advanced clinical practitioner to complete your personal care plan.  Depending on the condition, your plan could have included both over the counter or prescription medications.  If there is a problem please reply  once you have received a response from your provider.  Your safety is important to us.  If you have drug allergies check your prescription carefully.    You can use MyChart to ask questions about today's visit, request a non-urgent call back, or ask for a work or school excuse for 24 hours related to this e-Visit. If it has been greater than 24 hours you will need to follow up with your provider, or enter a new e-Visit to address those concerns.  You will get an e-mail in the next two days asking about your experience.  I hope that your e-visit has been valuable and will speed your recovery. Thank you for using e-visits.

## 2017-05-29 ENCOUNTER — Other Ambulatory Visit: Payer: Self-pay | Admitting: Obstetrics and Gynecology

## 2017-05-29 DIAGNOSIS — Z139 Encounter for screening, unspecified: Secondary | ICD-10-CM

## 2017-06-30 DIAGNOSIS — Z124 Encounter for screening for malignant neoplasm of cervix: Secondary | ICD-10-CM | POA: Diagnosis not present

## 2017-06-30 DIAGNOSIS — Z01419 Encounter for gynecological examination (general) (routine) without abnormal findings: Secondary | ICD-10-CM | POA: Diagnosis not present

## 2017-07-04 DIAGNOSIS — Z0000 Encounter for general adult medical examination without abnormal findings: Secondary | ICD-10-CM | POA: Diagnosis not present

## 2017-07-24 DIAGNOSIS — H52223 Regular astigmatism, bilateral: Secondary | ICD-10-CM | POA: Diagnosis not present

## 2018-01-28 MED FILL — NITROFURANTOIN MONO-MCR 100: 100 | 7 days supply | Qty: 14 | Fill #0

## 2018-02-23 ENCOUNTER — Ambulatory Visit
Admission: RE | Admit: 2018-02-23 | Discharge: 2018-02-23 | Disposition: A | Discharge status: Home / Self Care | Payer: 59 | Source: Ambulatory Visit | Attending: Obstetrics and Gynecology | Admitting: Obstetrics and Gynecology

## 2018-02-23 DIAGNOSIS — Z1231 Encounter for screening mammogram for malignant neoplasm of breast: Secondary | ICD-10-CM | POA: Diagnosis not present

## 2018-02-23 DIAGNOSIS — Z139 Encounter for screening, unspecified: Secondary | ICD-10-CM

## 2018-02-24 ENCOUNTER — Telehealth: Payer: 59 | Admitting: Physician Assistant

## 2018-02-24 DIAGNOSIS — J329 Chronic sinusitis, unspecified: Secondary | ICD-10-CM

## 2018-02-24 DIAGNOSIS — B9689 Other specified bacterial agents as the cause of diseases classified elsewhere: Secondary | ICD-10-CM

## 2018-02-24 MED ORDER — AMOXICILLIN-POT CLAVULANATE 875-125 MG PO TABS: 1.0000 | ORAL_TABLET | Freq: Two times a day (BID) | ORAL | 0 refills | Status: AC

## 2018-02-24 MED FILL — AMOX-CLAV 875-125 MG TABLET: 875-125 | 10 days supply | Qty: 20 | Fill #0

## 2018-02-24 NOTE — Progress Notes (Signed)

## 2018-04-02 ENCOUNTER — Telehealth: Payer: 59 | Admitting: Family

## 2018-04-02 DIAGNOSIS — B9689 Other specified bacterial agents as the cause of diseases classified elsewhere: Secondary | ICD-10-CM | POA: Diagnosis not present

## 2018-04-02 DIAGNOSIS — R059 Cough, unspecified: Secondary | ICD-10-CM

## 2018-04-02 DIAGNOSIS — R05 Cough: Secondary | ICD-10-CM

## 2018-04-02 DIAGNOSIS — J329 Chronic sinusitis, unspecified: Secondary | ICD-10-CM

## 2018-04-02 MED ORDER — AMOXICILLIN-POT CLAVULANATE 875-125 MG PO TABS: 1.0000 | ORAL_TABLET | Freq: Two times a day (BID) | ORAL | 0 refills | Status: AC

## 2018-04-02 MED ORDER — BENZONATATE 100 MG PO CAPS: 100.0000 mg | ORAL_CAPSULE | Freq: Three times a day (TID) | ORAL | 0 refills | Status: DC | PRN

## 2018-04-02 MED FILL — AMOX-CLAV 875-125 MG TABLET: 875-125 | 7 days supply | Qty: 14 | Fill #0

## 2018-04-02 MED FILL — BENZONATATE 100 MG CAP: 100 | 5 days supply | Qty: 30 | Fill #0

## 2018-04-02 NOTE — Progress Notes (Signed)
Thank you for the details you included in the comment boxes. Those details are very helpful in determining the best course of treatment for you and help us to provide the best care. I called you and was unable to reach you. Please call me back at [phone]. I am unsure as to how many days you have been using Augmentin. There are a few reasons why this may not help. 1. It is a virus and antibiotics won't make a difference. 2. The antibiotics will take longer to work. 3. The antibiotics are the wrong type. See my plan below. If you are unable to call me back, simply take the medication as prescribed below, for the total number of days, subtracting what you have taken (e.g., if you have taken 1 day of Augmentin and 2 pills, then simply subtract that 1 day from my plan below). This medication is actually ideal for your condition and I would give it a fair try.  We are sorry that you are not feeling well.  Here is how we plan to help!  Based on what you have shared with me it looks like you have sinusitis.  Sinusitis is inflammation and infection in the sinus cavities of the head.  Based on your presentation I believe you most likely have Acute Bacterial Sinusitis.  This is an infection caused by bacteria and is treated with antibiotics. I have prescribed Augmentin 875mg /125mg  one tablet twice daily with food, for 7 days. You may use an oral decongestant such as Mucinex D or if you have glaucoma or high blood pressure use plain Mucinex. Saline nasal spray help and can safely be used as often as needed for congestion.  If you develop worsening sinus pain, fever or notice severe headache and vision changes, or if symptoms are not better after completion of antibiotic, please schedule an appointment with a health care provider.    I have also sent tessalon perles 100mg , take 1-2 capsules every 8 hours as needed for cough.  Sinus infections are not as easily transmitted as other respiratory infection, however we  still recommend that you avoid close contact with loved ones, especially the very young and elderly.  Remember to wash your hands thoroughly throughout the day as this is the number one way to prevent the spread of infection!  Home Care:  Only take medications as instructed by your medical team.  Complete the entire course of an antibiotic.  Do not take these medications with alcohol.  A steam or ultrasonic humidifier can help congestion.  You can place a towel over your head and breathe in the steam from hot water coming from a faucet.  Avoid close contacts especially the very young and the elderly.  Cover your mouth when you cough or sneeze.  Always remember to wash your hands.  Get Help Right Away If:  You develop worsening fever or sinus pain.  You develop a severe head ache or visual changes.  Your symptoms persist after you have completed your treatment plan.  Make sure you  Understand these instructions.  Will watch your condition.  Will get help right away if you are not doing well or get worse.  Your e-visit answers were reviewed by a board certified advanced clinical practitioner to complete your personal care plan.  Depending on the condition, your plan could have included both over the counter or prescription medications.  If there is a problem please reply  once you have received a response from your provider.  Your safety is important to us.  If you have drug allergies check your prescription carefully.    You can use MyChart to ask questions about today's visit, request a non-urgent call back, or ask for a work or school excuse for 24 hours related to this e-Visit. If it has been greater than 24 hours you will need to follow up with your provider, or enter a new e-Visit to address those concerns.  You will get an e-mail in the next two days asking about your experience.  I hope that your e-visit has been valuable and will speed your recovery. Thank you for  using e-visits.

## 2018-05-22 ENCOUNTER — Telehealth: Payer: 59 | Admitting: Family

## 2018-05-22 DIAGNOSIS — H109 Unspecified conjunctivitis: Secondary | ICD-10-CM

## 2018-05-22 MED ORDER — POLYMYXIN B-TRIMETHOPRIM 10000-0.1 UNIT/ML-% OP SOLN: 1.0000 [drp] | Freq: Four times a day (QID) | OPHTHALMIC | 0 refills | Status: DC

## 2018-05-22 MED FILL — POLYMYXIN B/TMP EYE DROPS: 10000-0.1 | 50 days supply | Qty: 10 | Fill #0

## 2018-05-22 NOTE — Progress Notes (Signed)

## 2018-10-02 DIAGNOSIS — H52223 Regular astigmatism, bilateral: Secondary | ICD-10-CM | POA: Diagnosis not present

## 2018-11-16 DIAGNOSIS — Z124 Encounter for screening for malignant neoplasm of cervix: Secondary | ICD-10-CM | POA: Diagnosis not present

## 2018-11-16 DIAGNOSIS — Z1329 Encounter for screening for other suspected endocrine disorder: Secondary | ICD-10-CM | POA: Diagnosis not present

## 2018-11-16 DIAGNOSIS — Z01419 Encounter for gynecological examination (general) (routine) without abnormal findings: Secondary | ICD-10-CM | POA: Diagnosis not present

## 2018-11-16 DIAGNOSIS — Z130 Encounter for screening for diseases of the blood and blood-forming organs and certain disorders involving the immune mechanism: Secondary | ICD-10-CM | POA: Diagnosis not present

## 2018-11-16 DIAGNOSIS — Z13220 Encounter for screening for lipoid disorders: Secondary | ICD-10-CM | POA: Diagnosis not present

## 2018-12-28 ENCOUNTER — Telehealth: Payer: Self-pay | Admitting: Gastroenterology

## 2018-12-28 NOTE — Telephone Encounter (Signed)
If Tiffany Henderson is not having any GI issues and is planning to schedule colonoscopy for screening due to family history of colon cancer we can schedule it as a direct procedure.  Thanks

## 2018-12-28 NOTE — Telephone Encounter (Signed)
Pre Visit and Direct Colon scheduled °

## 2018-12-28 NOTE — Telephone Encounter (Signed)
Dr. Silverio Decamp,  Tiffany Henderson has chosen you to be her Gastroenterologist. Does Veeda need to be seen in office first(age) or can we schedule Direct Colon? Humna's mother had colon cancer.

## 2019-01-11 ENCOUNTER — Other Ambulatory Visit: Payer: Self-pay | Admitting: Gastroenterology

## 2019-01-11 DIAGNOSIS — Z1231 Encounter for screening mammogram for malignant neoplasm of breast: Secondary | ICD-10-CM

## 2019-02-05 ENCOUNTER — Ambulatory Visit (AMBULATORY_SURGERY_CENTER): Payer: 59 | Admitting: *Deleted

## 2019-02-05 ENCOUNTER — Other Ambulatory Visit: Payer: Self-pay

## 2019-02-05 VITALS — Temp 97.1°F | Ht 68.0 in | Wt 179.0 lb

## 2019-02-05 DIAGNOSIS — Z1159 Encounter for screening for other viral diseases: Secondary | ICD-10-CM

## 2019-02-05 DIAGNOSIS — Z800 Family history of malignant neoplasm of digestive organs: Secondary | ICD-10-CM

## 2019-02-05 NOTE — Progress Notes (Signed)
Patient is here in-person for PV. Patient denies any allergies to eggs or soy. Patient denies any problems with anesthesia/sedation. Patient denies any oxygen use at home. Patient denies taking any diet/weight loss medications or blood thinners. Patient is not being treated for MRSA or C-diff. EMMI education assisgned to the patient for the procedure, this was explained and instructions given to patient. COVID-19 screening test is on Monday 12/1 330pm, the pt is aware. Pt is aware that care partner will wait in the car during procedure; if they feel like they will be too hot or cold to wait in the car; they may wait in the 4 th floor lobby. Patient is aware to bring only one care partner. We want them to wear a mask (we do not have any that we can provide them), practice social distancing, and we will check their temperatures when they get here.  I did remind the patient that their care partner needs to stay in the parking lot the entire time and have a cell phone available, we will call them when the pt is ready for discharge. Patient will wear mask into building.    Pt has Suprep sample at home.

## 2019-02-19 ENCOUNTER — Encounter: Payer: 59 | Admitting: Gastroenterology

## 2019-02-26 ENCOUNTER — Encounter: Payer: Self-pay | Admitting: Gastroenterology

## 2019-03-09 ENCOUNTER — Ambulatory Visit (INDEPENDENT_AMBULATORY_CARE_PROVIDER_SITE_OTHER): Payer: 59

## 2019-03-09 ENCOUNTER — Other Ambulatory Visit: Payer: Self-pay | Admitting: Gastroenterology

## 2019-03-09 ENCOUNTER — Other Ambulatory Visit: Payer: Self-pay

## 2019-03-09 ENCOUNTER — Ambulatory Visit
Admission: RE | Admit: 2019-03-09 | Discharge: 2019-03-09 | Disposition: A | Discharge status: Home / Self Care | Payer: 59 | Source: Ambulatory Visit | Attending: Gastroenterology | Admitting: Gastroenterology

## 2019-03-09 DIAGNOSIS — Z1159 Encounter for screening for other viral diseases: Secondary | ICD-10-CM

## 2019-03-09 DIAGNOSIS — Z1231 Encounter for screening mammogram for malignant neoplasm of breast: Secondary | ICD-10-CM | POA: Diagnosis not present

## 2019-03-10 LAB — SARS CORONAVIRUS 2 (TAT 6-24 HRS): SARS Coronavirus 2: NEGATIVE

## 2019-03-12 ENCOUNTER — Other Ambulatory Visit: Payer: Self-pay

## 2019-03-12 ENCOUNTER — Encounter: Payer: Self-pay | Admitting: Gastroenterology

## 2019-03-12 ENCOUNTER — Ambulatory Visit (AMBULATORY_SURGERY_CENTER): Payer: 59 | Admitting: Gastroenterology

## 2019-03-12 VITALS — BP 112/72 | HR 68 | Temp 98.3°F | Resp 13 | Ht 68.0 in | Wt 179.0 lb

## 2019-03-12 DIAGNOSIS — Z1211 Encounter for screening for malignant neoplasm of colon: Secondary | ICD-10-CM | POA: Diagnosis not present

## 2019-03-12 DIAGNOSIS — Z800 Family history of malignant neoplasm of digestive organs: Secondary | ICD-10-CM

## 2019-03-12 MED ORDER — SODIUM CHLORIDE 0.9 % IV SOLN: 500.0000 mL | Freq: Once | INTRAVENOUS | Status: DC

## 2019-03-12 NOTE — Op Note (Signed)
[facility] Patient Name: Tiffany Henderson Procedure Date: 03/12/2019 8:45 AM MRN: [id number] Endoscopist: Napoleon FormKavitha V. Nandigam , MD Age: 43 Referring MD:  Date of Birth: 23-Feb-1976 Gender: Female Account #: [id number] Procedure:                Colonoscopy Indications:              Screening in patient at increased risk: Colorectal                            cancer in mother before age 41 Medicines:                Monitored Anesthesia Care Procedure:                Pre-Anesthesia Assessment:                           - Prior to the procedure, a History and Physical                            was performed, and patient medications and                            allergies were reviewed. The patient's tolerance of                            previous anesthesia was also reviewed. The risks                            and benefits of the procedure and the sedation                            options and risks were discussed with the patient.                            All questions were answered, and informed consent                            was obtained. Prior Anticoagulants: The patient has                            taken no previous anticoagulant or antiplatelet                            agents. ASA Grade Assessment: I - A normal, healthy                            patient. After reviewing the risks and benefits,                            the patient was deemed in satisfactory condition to                            undergo the procedure.  After obtaining informed consent, the colonoscope                            was passed under direct vision. Throughout the                            procedure, the patient's blood pressure, pulse, and                            oxygen saturations were monitored continuously. The                            Colonoscope was introduced through the anus and                            advanced to the the cecum, identified  by                            appendiceal orifice and ileocecal valve. The                            Colonoscope was introduced through the anus and                            advanced to the the cecum, identified by                            appendiceal orifice and ileocecal valve. The                            colonoscopy was technically difficult and complex                            due to an endoscope malfunction. Successful                            completion of the procedure was aided by                            withdrawing the scope and replacing with the                            pediatric colonoscope. The patient tolerated the                            procedure well. The quality of the bowel                            preparation was excellent. The ileocecal valve,                            appendiceal orifice, and rectum were photographed. Scope In: 8:58:19 AM Scope Out: 9:24:36 AM Scope Withdrawal Time: 0 hours 22 minutes 47 seconds  Total Procedure Duration: 0 hours  26 minutes 17 seconds  Findings:                 The perianal and digital rectal examinations were                            normal.                           Scattered small and large-mouthed diverticula were                            found in the sigmoid colon, descending colon,                            transverse colon and ascending colon.                           The exam was otherwise without abnormality. Complications:            No immediate complications. Estimated Blood Loss:     Estimated blood loss: none. Impression:               - Moderate diverticulosis in the sigmoid colon, in                            the descending colon, in the transverse colon and                            in the ascending colon.                           - The examination was otherwise normal.                           - No specimens collected. Recommendation:           - Patient has a contact number  available for                            emergencies. The signs and symptoms of potential                            delayed complications were discussed with the                            patient. Return to normal activities tomorrow.                            Written discharge instructions were provided to the                            patient.                           - Resume previous diet.                           -  Continue present medications.                           - Repeat colonoscopy in 5 years for screening                            purposes. Mauri Pole, MD 03/12/2019 9:28:19 AM This report has been signed electronically.

## 2019-03-12 NOTE — Progress Notes (Signed)
To PACU, VSS. Report to RN.tb 

## 2019-03-12 NOTE — Progress Notes (Signed)
Scope changed to #53.tb

## 2019-03-12 NOTE — Patient Instructions (Signed)
   Information on diverticulosis given to you today  Resume usual diet and medications    YOU HAD AN ENDOSCOPIC PROCEDURE TODAY AT [facility]:   Refer to the procedure report that was given to you for any specific questions about what was found during the examination.  If the procedure report does not answer your questions, please call your gastroenterologist to clarify.  If you requested that your care partner not be given the details of your procedure findings, then the procedure report has been included in a sealed envelope for you to review at your convenience later.  YOU SHOULD EXPECT: Some feelings of bloating in the abdomen. Passage of more gas than usual.  Walking can help get rid of the air that was put into your GI tract during the procedure and reduce the bloating. If you had a lower endoscopy (such as a colonoscopy or flexible sigmoidoscopy) you may notice spotting of blood in your stool or on the toilet paper. If you underwent a bowel prep for your procedure, you may not have a normal bowel movement for a few days.  Please Note:  You might notice some irritation and congestion in your nose or some drainage.  This is from the oxygen used during your procedure.  There is no need for concern and it should clear up in a day or so.  SYMPTOMS TO REPORT IMMEDIATELY:   Following lower endoscopy (colonoscopy or flexible sigmoidoscopy):  Excessive amounts of blood in the stool  Significant tenderness or worsening of abdominal pains  Swelling of the abdomen that is new, acute  Fever of 100F or higher    For urgent or emergent issues, a gastroenterologist can be reached at any hour by calling [phone].   DIET:  We do recommend a small meal at first, but then you may proceed to your regular diet.  Drink plenty of fluids but you should avoid alcoholic beverages for 24 hours.  ACTIVITY:  You should plan to take it easy for the rest of today and you should NOT DRIVE  or use heavy machinery until tomorrow (because of the sedation medicines used during the test).    FOLLOW UP: Our staff will call the number listed on your records 48-72 hours following your procedure to check on you and address any questions or concerns that you may have regarding the information given to you following your procedure. If we do not reach you, we will leave a message.  We will attempt to reach you two times.  During this call, we will ask if you have developed any symptoms of COVID 19. If you develop any symptoms (ie: fever, flu-like symptoms, shortness of breath, cough etc.) before then, please call [phone].  If you test positive for Covid 19 in the 2 weeks post procedure, please call and report this information to [place].    If any biopsies were taken you will be contacted by phone or by letter within the next 1-3 weeks.  Please call us at [phone] if you have not heard about the biopsies in 3 weeks.    SIGNATURES/CONFIDENTIALITY: You and/or your care partner have signed paperwork which will be entered into your electronic medical record.  These signatures attest to the fact that that the information above on your After Visit Summary has been reviewed and is understood.  Full responsibility of the confidentiality of this discharge information lies with you and/or your care-partner.

## 2019-03-12 NOTE — Progress Notes (Signed)
Pt's states no medical or surgical changes since previsit or office visit. 

## 2019-03-16 ENCOUNTER — Telehealth: Payer: Self-pay

## 2019-03-16 NOTE — Telephone Encounter (Signed)
Left message on follow up call. 

## 2019-03-16 NOTE — Telephone Encounter (Signed)
  Follow up Call-  Call back number 03/12/2019  Post procedure Call Back phone  # [phone]  Permission to leave phone message Yes  Some recent data might be hidden     Patient questions:  Do you have a fever, pain , or abdominal swelling? No. Pain Score  0 *  Have you tolerated food without any problems? Yes.    Have you been able to return to your normal activities? Yes.    Do you have any questions about your discharge instructions: Diet   No. Medications  No. Follow up visit  No.  Do you have questions or concerns about your Care? No.  Actions: * If pain score is 4 or above: No action needed, pain <4. 1. Have you developed a fever since your procedure? no  2.   Have you had an respiratory symptoms (SOB or cough) since your procedure? no  3.   Have you tested positive for COVID 19 since your procedure no  4.   Have you had any family members/close contacts diagnosed with the COVID 19 since your procedure?  no   If yes to any of these questions please route to Joylene John, RN and Alphonsa Gin, Therapist, sports.

## 2019-10-22 DIAGNOSIS — B850 Pediculosis due to Pediculus humanus capitis: Secondary | ICD-10-CM | POA: Diagnosis not present

## 2019-11-23 ENCOUNTER — Other Ambulatory Visit: Payer: Self-pay | Admitting: Gastroenterology

## 2019-11-23 DIAGNOSIS — Z1231 Encounter for screening mammogram for malignant neoplasm of breast: Secondary | ICD-10-CM

## 2020-01-20 DIAGNOSIS — Z01419 Encounter for gynecological examination (general) (routine) without abnormal findings: Secondary | ICD-10-CM | POA: Diagnosis not present

## 2020-02-20 DIAGNOSIS — H52223 Regular astigmatism, bilateral: Secondary | ICD-10-CM | POA: Diagnosis not present

## 2020-03-09 ENCOUNTER — Other Ambulatory Visit: Payer: Self-pay

## 2020-03-09 ENCOUNTER — Ambulatory Visit
Admission: RE | Admit: 2020-03-09 | Discharge: 2020-03-09 | Disposition: A | Discharge status: Home / Self Care | Payer: 59 | Source: Ambulatory Visit

## 2020-03-09 DIAGNOSIS — Z1231 Encounter for screening mammogram for malignant neoplasm of breast: Secondary | ICD-10-CM

## 2020-03-28 ENCOUNTER — Telehealth: Payer: 59 | Admitting: Physician Assistant

## 2020-03-28 DIAGNOSIS — U071 COVID-19: Secondary | ICD-10-CM

## 2020-03-28 DIAGNOSIS — R0981 Nasal congestion: Secondary | ICD-10-CM | POA: Diagnosis not present

## 2020-03-28 MED ORDER — AZELASTINE HCL 0.1 % NA SOLN: 2.0000 | Freq: Two times a day (BID) | NASAL | 12 refills | Status: DC

## 2020-03-28 MED ORDER — SALINE SPRAY 0.65 % NA SOLN: 1.0000 | NASAL | 0 refills | Status: DC | PRN

## 2020-03-28 MED ORDER — BENZONATATE 100 MG PO CAPS: 100.0000 mg | ORAL_CAPSULE | Freq: Three times a day (TID) | ORAL | 0 refills | Status: AC

## 2020-03-28 NOTE — Progress Notes (Signed)
E-Visit for [employer] Virus  We are sorry you are not feeling well. We are here to help!  Given that you have tested positive for COVID, it is vitally important that you stay home so you do not spread it to others.      Please continue isolation at home, for at least 10 days since the start of your symptoms and until you have had 24 hours with no fever (without taking a fever reducer) and with improving of symptoms.  Most cases improve 10 days from onset but we have seen a small number of patients who have gotten worse after the 10 days.  Please be sure to watch for worsening symptoms and remain taking the proper precautions.   Go to the nearest hospital ED for assessment if fever/cough/breathlessness are severe or illness seems like a threat to life.    The following symptoms may appear 2-14 days after exposure: . Fever . Cough . Shortness of breath or difficulty breathing . Chills . Repeated shaking with chills . Muscle pain . Headache . Sore throat . New loss of taste or smell . Fatigue . Congestion or runny nose . Nausea or vomiting . Diarrhea  You have been enrolled in [facility] Monitoring for COVID-19. Daily you will receive a questionnaire within the MyChart website. Our COVID-19 response team will be monitoring your responses daily.  You can use medication such as A prescription cough medication called Tessalon Perles 100 mg. You may take 1-2 capsules every 8 hours as needed for cough and A prescription for Azelastine nasal spray 2 sprays in each nostril twice per day. You can also use saline nasal spray.  I would also recommend that you place a humidifier in your room to help with your breathing and nasal congestion.   You may also take acetaminophen (Tylenol) as needed for fever.  HOME CARE: . Only take medications as instructed by your medical team. . Drink plenty of fluids and get plenty of rest. . A steam or ultrasonic humidifier can help if you have congestion.    GET HELP RIGHT AWAY IF YOU HAVE EMERGENCY WARNING SIGNS.  Call 911 or proceed to your closest emergency facility if: . You develop worsening high fever. . Trouble breathing . Bluish lips or face . Persistent pain or pressure in the chest . New confusion . Inability to wake or stay awake . You cough up blood. . Your symptoms become more severe . Inability to hold down food or fluids  This list is not all possible symptoms. Contact your medical provider for any symptoms that are severe or concerning to you.    Your e-visit answers were reviewed by a board certified advanced clinical practitioner to complete your personal care plan.  Depending on the condition, your plan could have included both over the counter or prescription medications.  If there is a problem please reply once you have received a response from your provider.  Your safety is important to [place].  If you have drug allergies check your prescription carefully.    You can use MyChart to ask questions about today's visit, request a non-urgent call back, or ask for a work or school excuse for 24 hours related to this e-Visit. If it has been greater than 24 hours you will need to follow up with your provider, or enter a new e-Visit to address those concerns. You will get an e-mail in the next two days asking about your experience.  I hope that your  e-visit has been valuable and will speed your recovery. Thank you for using e-visits.  Approximately 5 minutes was spent documenting and reviewing patient's chart.

## 2020-04-13 ENCOUNTER — Telehealth: Payer: Self-pay | Admitting: Gastroenterology

## 2020-04-13 ENCOUNTER — Other Ambulatory Visit: Payer: Self-pay

## 2020-04-13 DIAGNOSIS — U099 Post covid-19 condition, unspecified: Secondary | ICD-10-CM

## 2020-04-13 DIAGNOSIS — G8929 Other chronic pain: Secondary | ICD-10-CM

## 2020-04-13 MED ORDER — TRAMADOL HCL 50 MG PO TABS: 50.0000 mg | ORAL_TABLET | Freq: Three times a day (TID) | ORAL | 0 refills | Status: DC

## 2020-04-13 MED FILL — traMADol HCL 50 MG TABS: 50 | 5 days supply | Qty: 15 | Fill #0

## 2020-04-13 NOTE — Telephone Encounter (Signed)
Contacted the patient and confirmed her pharmacy.  Urgent neurology referral placed. Patient agrees to this plan of care.

## 2020-04-13 NOTE — Telephone Encounter (Signed)
Tiffany Henderson is having severe headache since Covid almost daily, previously she was having headaches intermittently with migraine.  Currently she is having to take 3-6 Excedrin per day and Tylenol with no significant relief.  She is requesting something to help with the headaches.  [name], please send prescription for tramadol 50 mg every 8 hours as needed for 5 days Please send referral to [facility] neurology, Dr. Nita Sickle for management of post Covid headaches and worsening migraine.  [name] , MD [phone]

## 2020-04-14 ENCOUNTER — Encounter: Payer: Self-pay | Admitting: Neurology

## 2020-05-01 ENCOUNTER — Ambulatory Visit: Payer: 59 | Admitting: Neurology

## 2020-06-15 ENCOUNTER — Other Ambulatory Visit (HOSPITAL_COMMUNITY): Payer: Self-pay | Admitting: Obstetrics and Gynecology

## 2020-06-15 MED FILL — MEDROXYPROGESTERONE 10 MG T: 10 | 10 days supply | Qty: 10 | Fill #0

## 2020-08-22 ENCOUNTER — Telehealth: Payer: 59 | Admitting: Family

## 2020-08-22 DIAGNOSIS — R399 Unspecified symptoms and signs involving the genitourinary system: Secondary | ICD-10-CM

## 2020-08-22 MED ORDER — CEPHALEXIN 500 MG PO CAPS: 500.0000 mg | ORAL_CAPSULE | Freq: Two times a day (BID) | ORAL | 0 refills | Status: DC

## 2020-08-22 NOTE — Progress Notes (Signed)

## 2020-08-25 ENCOUNTER — Telehealth: Payer: Self-pay | Admitting: Gastroenterology

## 2020-08-25 ENCOUNTER — Other Ambulatory Visit: Payer: 59

## 2020-08-25 DIAGNOSIS — R300 Dysuria: Secondary | ICD-10-CM

## 2020-08-25 NOTE — Telephone Encounter (Signed)
Patient having dysuria.  PCP wanted urine culture.  Patient asked if I would place the order.

## 2020-08-26 LAB — URINE CULTURE
MICRO NUMBER:: 11916190
Result:: NO GROWTH
SPECIMEN QUALITY:: ADEQUATE

## 2020-09-21 ENCOUNTER — Other Ambulatory Visit: Payer: Self-pay

## 2020-09-21 ENCOUNTER — Ambulatory Visit: Payer: 59 | Admitting: Internal Medicine

## 2020-09-21 ENCOUNTER — Other Ambulatory Visit (HOSPITAL_COMMUNITY): Payer: Self-pay

## 2020-09-21 ENCOUNTER — Encounter: Payer: Self-pay | Admitting: Internal Medicine

## 2020-09-21 VITALS — BP 120/80 | HR 64 | Temp 99.0°F | Ht 68.5 in | Wt 176.4 lb

## 2020-09-21 DIAGNOSIS — Z0000 Encounter for general adult medical examination without abnormal findings: Secondary | ICD-10-CM

## 2020-09-21 DIAGNOSIS — R635 Abnormal weight gain: Secondary | ICD-10-CM | POA: Insufficient documentation

## 2020-09-21 DIAGNOSIS — M898X9 Other specified disorders of bone, unspecified site: Secondary | ICD-10-CM | POA: Diagnosis not present

## 2020-09-21 LAB — COMPREHENSIVE METABOLIC PANEL
ALT: 17 U/L (ref 0–35)
AST: 15 U/L (ref 0–37)
Albumin: 4.4 g/dL (ref 3.5–5.2)
Alkaline Phosphatase: 93 U/L (ref 39–117)
BUN: 13 mg/dL (ref 6–23)
CO2: 28 mEq/L (ref 19–32)
Calcium: 9.5 mg/dL (ref 8.4–10.5)
Chloride: 104 mEq/L (ref 96–112)
Creatinine, Ser: 0.72 mg/dL (ref 0.40–1.20)
GFR: 103.01 mL/min (ref >60.00)
Glucose, Bld: 71 mg/dL (ref 70–99)
Potassium: 4.1 mEq/L (ref 3.5–5.1)
Sodium: 137 mEq/L (ref 135–145)
Total Bilirubin: 0.4 mg/dL (ref 0.2–1.2)
Total Protein: 7.5 g/dL (ref 6.0–8.3)

## 2020-09-21 LAB — LIPID PANEL
Cholesterol: 163 mg/dL (ref 0–200)
HDL: 53.6 mg/dL (ref >39.00)
LDL Cholesterol: 95 mg/dL (ref 0–99)
NonHDL: 109.58
Total CHOL/HDL Ratio: 3
Triglycerides: 74 mg/dL (ref 0.0–149.0)
VLDL: 14.8 mg/dL (ref 0.0–40.0)

## 2020-09-21 LAB — CBC
HCT: 39.7 % (ref 36.0–46.0)
Hemoglobin: 13.2 g/dL (ref 12.0–15.0)
MCHC: 33.2 g/dL (ref 30.0–36.0)
MCV: 88 fl (ref 78.0–100.0)
Platelets: 228 10*3/uL (ref 150.0–400.0)
RBC: 4.51 Mil/uL (ref 3.87–5.11)
RDW: 13.7 % (ref 11.5–15.5)
WBC: 7.6 10*3/uL (ref 4.0–10.5)

## 2020-09-21 LAB — VITAMIN B12: Vitamin B-12: 206 pg/mL — ABNORMAL LOW (ref 211–911)

## 2020-09-21 LAB — VITAMIN D 25 HYDROXY (VIT D DEFICIENCY, FRACTURES): VITD: 31.98 ng/mL (ref 30.00–100.00)

## 2020-09-21 LAB — TSH: TSH: 1.03 u[IU]/mL (ref 0.35–4.50)

## 2020-09-21 MED ORDER — SEMAGLUTIDE-WEIGHT MANAGEMENT 0.25 MG/0.5ML ~~LOC~~ SOAJ
0.2500 mg | SUBCUTANEOUS | 3 refills | Status: DC
  Filled 2020-09-21: qty 2, 28d supply, fill #0

## 2020-09-21 MED ORDER — SAXENDA 18 MG/3ML ~~LOC~~ SOPN
PEN_INJECTOR | SUBCUTANEOUS | 0 refills | Status: DC
  Filled 2020-09-21: qty 30, 74d supply, fill #0

## 2020-09-21 NOTE — Patient Instructions (Signed)
We will check the labs today. 

## 2020-09-21 NOTE — Assessment & Plan Note (Signed)
Rx wegovy for weight management.

## 2020-09-21 NOTE — Assessment & Plan Note (Signed)
With itching in the legs. Will check CBC, TSH, vitamin D and B12 and CMP for cause. If persistent and no cause could consider x-ray to assess the bones directly.

## 2020-09-21 NOTE — Progress Notes (Signed)
   Subjective:   Patient ID: Tiffany Henderson, female    DOB: 07/16/1975, 45 y.o.   MRN: [id number]  HPI The patient is a 45 YO female coming in new for concerns about weight gain (since 40s, no change in diet or exercise, would like to try ozempic or similar, has tried changing diet without results), and some itching in the bones (in the thigh bones randomly, then she will scratch and then get some bruising, not often, not progressive, denies night sweats or weight loss).   PMH, [facility], social history reviewed and updated  Review of Systems  Constitutional:  Positive for unexpected weight change. Negative for activity change, appetite change and fatigue.  HENT: Negative.    Eyes: Negative.   Respiratory:  Negative for cough, chest tightness and shortness of breath.   Cardiovascular:  Negative for chest pain, palpitations and leg swelling.  Gastrointestinal:  Negative for abdominal distention, abdominal pain, constipation, diarrhea, nausea and vomiting.  Musculoskeletal: Negative.   Skin: Negative.   Neurological: Negative.   Psychiatric/Behavioral: Negative.     Objective:  Physical Exam Constitutional:      Appearance: She is well-developed.  HENT:     Head: Normocephalic and atraumatic.  Cardiovascular:     Rate and Rhythm: Normal rate and regular rhythm.  Pulmonary:     Effort: Pulmonary effort is normal. No respiratory distress.     Breath sounds: Normal breath sounds. No wheezing or rales.  Abdominal:     General: Bowel sounds are normal. There is no distension.     Palpations: Abdomen is soft.     Tenderness: There is no abdominal tenderness. There is no rebound.  Musculoskeletal:     Cervical back: Normal range of motion.  Skin:    General: Skin is warm and dry.  Neurological:     Mental Status: She is alert and oriented to person, place, and time.     Coordination: Coordination normal.    Vitals:   09/21/20 1050  BP: 120/80  Pulse: 64  Temp: 99 F (37.2 C)   TempSrc: Oral  SpO2: 99%  Weight: 176 lb 6.4 oz (80 kg)  Height: 5' 8.5" (1.74 m)    This visit occurred during the SARS-CoV-2 public health emergency.  Safety protocols were in place, including screening questions prior to the visit, additional usage of staff PPE, and extensive cleaning of exam room while observing appropriate contact time as indicated for disinfecting solutions.   Assessment & Plan:

## 2020-09-22 ENCOUNTER — Telehealth: Payer: Self-pay

## 2020-09-22 ENCOUNTER — Other Ambulatory Visit (HOSPITAL_COMMUNITY): Payer: Self-pay

## 2020-09-22 MED ORDER — QSYMIA 7.5-46 MG PO CP24: 1.0000 | ORAL_CAPSULE | Freq: Every day | ORAL | 3 refills | Status: DC

## 2020-09-22 NOTE — Telephone Encounter (Signed)
Prior Authorization submitted for [employer].  Key: [id number]  Waiting for determination from insurance.

## 2020-09-25 NOTE — Telephone Encounter (Signed)
Patient is requesting Phentermine-Topiramate (QSYMIA) 7.5-46 MG CP24 be sent to [facility]. Please advise

## 2020-09-26 ENCOUNTER — Other Ambulatory Visit (HOSPITAL_COMMUNITY): Payer: Self-pay

## 2020-09-26 MED ORDER — QSYMIA 7.5-46 MG PO CP24
1.0000 | ORAL_CAPSULE | Freq: Every day | ORAL | 3 refills | Status: DC
  Filled 2020-09-26: qty 30, 30d supply, fill #0

## 2020-09-26 NOTE — Telephone Encounter (Signed)
See below

## 2020-09-26 NOTE — Addendum Note (Signed)
Addended by: Myrlene Broker on: 09/26/2020 09:33 AM   Modules accepted: Orders

## 2020-09-26 NOTE — Addendum Note (Signed)
Addended by: Hillard Danker A on: 09/26/2020 09:30 AM   Modules accepted: Orders

## 2020-09-27 ENCOUNTER — Other Ambulatory Visit (HOSPITAL_COMMUNITY): Payer: Self-pay

## 2020-09-28 NOTE — Telephone Encounter (Signed)
    Patient calling for status of prior auth for Qsymia

## 2020-09-29 ENCOUNTER — Other Ambulatory Visit (HOSPITAL_COMMUNITY): Payer: Self-pay

## 2020-10-02 ENCOUNTER — Other Ambulatory Visit (HOSPITAL_COMMUNITY): Payer: Self-pay

## 2020-10-02 NOTE — Telephone Encounter (Signed)
   Patient called again for update. She is requesting a call back and can be reached at [phone].

## 2020-10-03 ENCOUNTER — Other Ambulatory Visit (HOSPITAL_COMMUNITY): Payer: Self-pay

## 2020-10-03 ENCOUNTER — Telehealth: Payer: Self-pay

## 2020-10-03 NOTE — Telephone Encounter (Signed)
Initiated a prior authorization on covermymeds for Qysymia. Key: [id number] PA Case ID: [id number] ICD 10 Code: R63.5   Awaiting determination.

## 2020-10-10 ENCOUNTER — Other Ambulatory Visit (HOSPITAL_COMMUNITY): Payer: Self-pay

## 2020-10-11 ENCOUNTER — Other Ambulatory Visit (HOSPITAL_COMMUNITY): Payer: Self-pay

## 2020-10-21 ENCOUNTER — Other Ambulatory Visit (HOSPITAL_COMMUNITY): Payer: Self-pay

## 2020-10-23 ENCOUNTER — Other Ambulatory Visit (HOSPITAL_COMMUNITY): Payer: Self-pay

## 2020-10-26 ENCOUNTER — Other Ambulatory Visit (HOSPITAL_COMMUNITY): Payer: Self-pay

## 2021-01-24 ENCOUNTER — Other Ambulatory Visit: Payer: Self-pay | Admitting: Gastroenterology

## 2021-01-24 DIAGNOSIS — Z1231 Encounter for screening mammogram for malignant neoplasm of breast: Secondary | ICD-10-CM

## 2021-03-12 ENCOUNTER — Ambulatory Visit: Payer: 59

## 2021-03-13 ENCOUNTER — Telehealth: Payer: 59 | Admitting: Emergency Medicine

## 2021-03-13 ENCOUNTER — Other Ambulatory Visit (HOSPITAL_COMMUNITY): Payer: Self-pay

## 2021-03-13 DIAGNOSIS — J069 Acute upper respiratory infection, unspecified: Secondary | ICD-10-CM | POA: Diagnosis not present

## 2021-03-13 MED ORDER — OPTICHAMBER DIAMOND MISC
1.0000 | 0 refills | Status: DC | PRN
  Filled 2021-03-13: qty 1, 30d supply, fill #0

## 2021-03-13 MED ORDER — ALBUTEROL SULFATE HFA 108 (90 BASE) MCG/ACT IN AERS
2.0000 | INHALATION_SPRAY | Freq: Four times a day (QID) | RESPIRATORY_TRACT | 0 refills | Status: DC | PRN
  Filled 2021-03-13: qty 8.5, 25d supply, fill #0

## 2021-03-13 MED ORDER — PREDNISONE 5 MG (21) PO TBPK
ORAL_TABLET | ORAL | 0 refills | Status: DC
  Filled 2021-03-13: qty 21, 6d supply, fill #0

## 2021-03-13 NOTE — Progress Notes (Signed)
We are sorry that you are not feeling well.  Here is how we plan to help!  Based on your presentation I believe you most likely have A cough due to a virus.  This is called viral bronchitis and is best treated by rest, plenty of fluids and control of the cough.  You may use Ibuprofen or Tylenol as directed to help your symptoms.     In addition you may use A non-prescription cough medication called Mucinex DM: take 2 tablets every 12 hours.  I have prescribed a prednisone taper and an albuterol inhaler with a spacer for you. If this treatment plan doesn't help relieve your chest pressure, you will need to be seen in person such as at an urgent care.   From your responses in the eVisit questionnaire you describe inflammation in the upper respiratory tract which is causing a significant cough.  This is commonly called Bronchitis and has four common causes:   Allergies Viral Infections Acid Reflux Bacterial Infection Allergies, viruses and acid reflux are treated by controlling symptoms or eliminating the cause. An example might be a cough caused by taking certain blood pressure medications. You stop the cough by changing the medication. Another example might be a cough caused by acid reflux. Controlling the reflux helps control the cough.  USE OF BRONCHODILATOR ("RESCUE") INHALERS: There is a risk from using your bronchodilator too frequently.  The risk is that over-reliance on a medication which only relaxes the muscles surrounding the breathing tubes can reduce the effectiveness of medications prescribed to reduce swelling and congestion of the tubes themselves.  Although you feel brief relief from the bronchodilator inhaler, your asthma may actually be worsening with the tubes becoming more swollen and filled with mucus.  This can delay other crucial treatments, such as oral steroid medications. If you need to use a bronchodilator inhaler daily, several times per day, you should discuss this with  your provider.  There are probably better treatments that could be used to keep your asthma under control.     HOME CARE Only take medications as instructed by your medical team. Complete the entire course of an antibiotic. Drink plenty of fluids and get plenty of rest. Avoid close contacts especially the very young and the elderly Cover your mouth if you cough or cough into your sleeve. Always remember to wash your hands A steam or ultrasonic humidifier can help congestion.   GET HELP RIGHT AWAY IF: You develop worsening fever. You become short of breath You cough up blood. Your symptoms persist after you have completed your treatment plan MAKE SURE YOU  Understand these instructions. Will watch your condition. Will get help right away if you are not doing well or get worse.    Thank you for choosing an e-visit.  Your e-visit answers were reviewed by a board certified advanced clinical practitioner to complete your personal care plan. Depending upon the condition, your plan could have included both over the counter or prescription medications.  Please review your pharmacy choice. Make sure the pharmacy is open so you can pick up prescription now. If there is a problem, you may contact your provider through [employer] and have the prescription routed to another pharmacy.  Your safety is important to [place]. If you have drug allergies check your prescription carefully.   For the next 24 hours you can use MyChart to ask questions about today's visit, request a non-urgent call back, or ask for a work or school excuse.  You will get an email in the next two days asking about your experience. I hope that your e-visit has been valuable and will speed your recovery.   I have spent 5 minutes in review of e-visit questionnaire, review and updating patient chart, medical decision making and response to patient.   Rica Mast, PhD, FNP-BC

## 2021-03-20 ENCOUNTER — Ambulatory Visit
Admission: RE | Admit: 2021-03-20 | Discharge: 2021-03-20 | Disposition: A | Discharge status: Home / Self Care | Payer: 59 | Source: Ambulatory Visit | Attending: Gastroenterology | Admitting: Gastroenterology

## 2021-03-20 ENCOUNTER — Other Ambulatory Visit: Payer: Self-pay

## 2021-03-20 DIAGNOSIS — Z01419 Encounter for gynecological examination (general) (routine) without abnormal findings: Secondary | ICD-10-CM | POA: Diagnosis not present

## 2021-03-20 DIAGNOSIS — Z1231 Encounter for screening mammogram for malignant neoplasm of breast: Secondary | ICD-10-CM

## 2021-03-26 ENCOUNTER — Encounter: Payer: Self-pay | Admitting: Internal Medicine

## 2021-03-26 ENCOUNTER — Telehealth: Payer: 59 | Admitting: Family Medicine

## 2021-03-26 ENCOUNTER — Other Ambulatory Visit (HOSPITAL_COMMUNITY): Payer: Self-pay

## 2021-03-26 DIAGNOSIS — J0100 Acute maxillary sinusitis, unspecified: Secondary | ICD-10-CM

## 2021-03-26 MED ORDER — AMOXICILLIN-POT CLAVULANATE 875-125 MG PO TABS
1.0000 | ORAL_TABLET | Freq: Two times a day (BID) | ORAL | 0 refills | Status: AC
  Filled 2021-03-26: qty 14, 7d supply, fill #0

## 2021-03-26 NOTE — Progress Notes (Signed)

## 2021-07-20 IMAGING — MG DIGITAL SCREENING BILAT W/ TOMO W/ CAD
8 series · 9 of 24 positions shown · non-contrast
Comparison: Previous exam(s).

CLINICAL DATA: Screening.

EXAM:
DIGITAL SCREENING BILATERAL MAMMOGRAM WITH TOMO AND CAD

[R CC synth-2D]
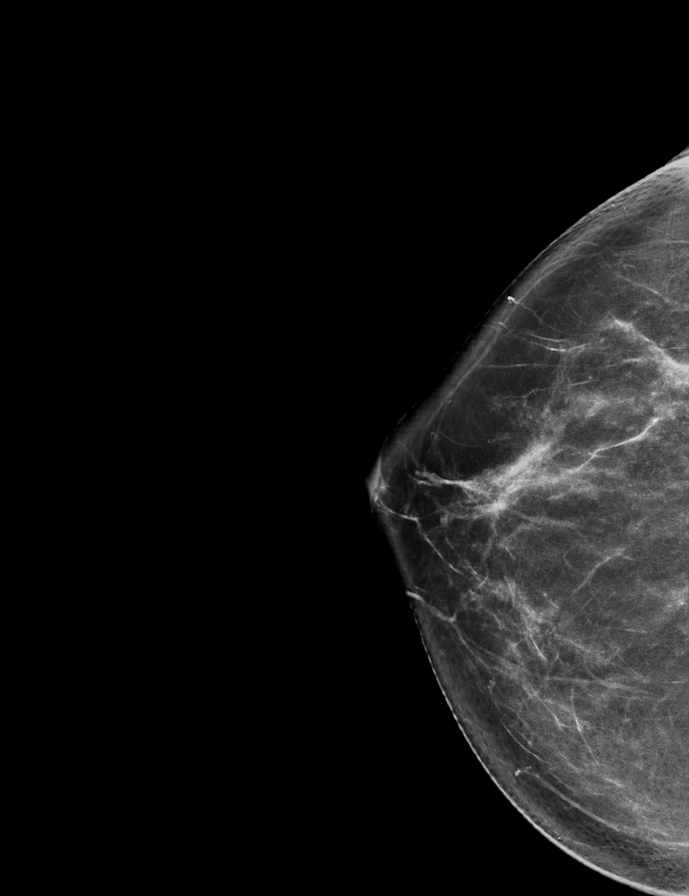

[L MLO synth-2D]
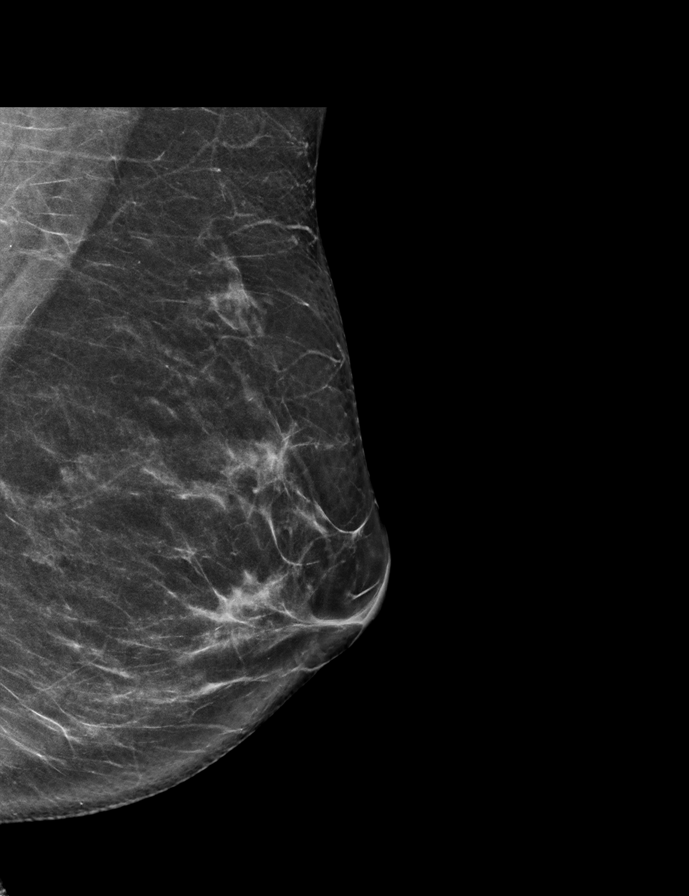

[L CC synth-2D]
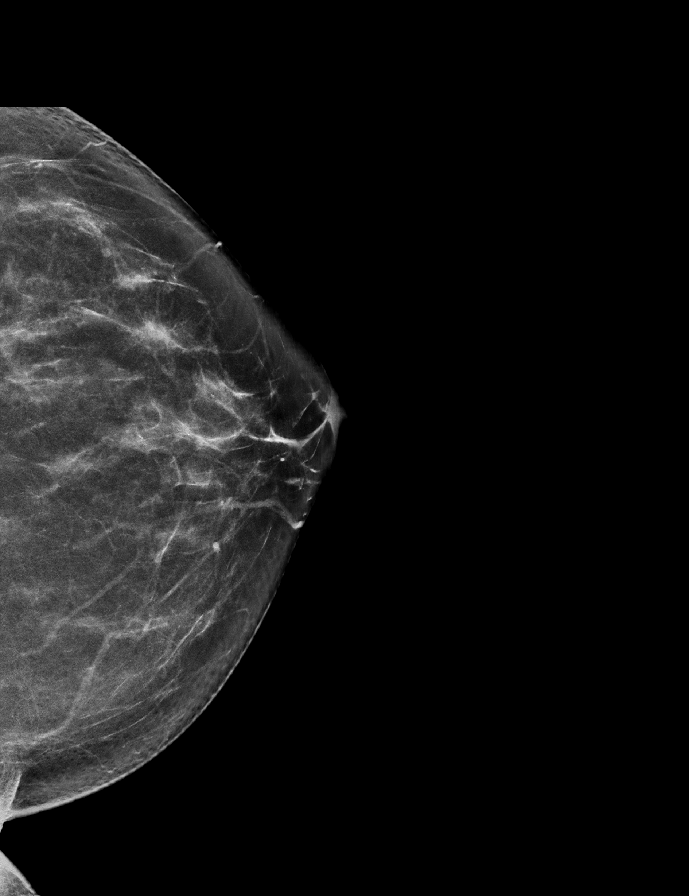

[R MLO synth-2D]
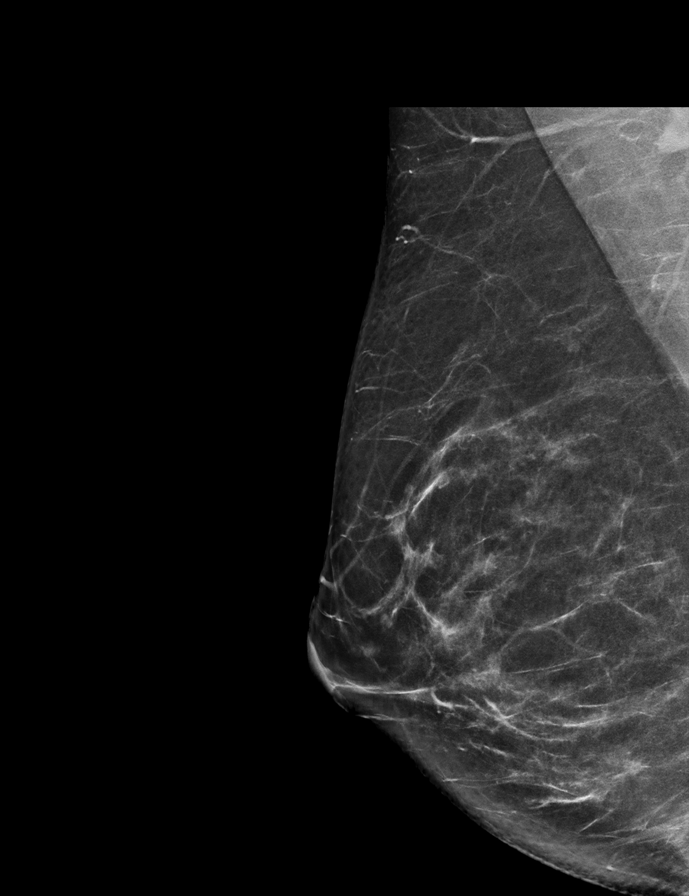

[L CC tomo · 2 of 68 frames shown]
[frame 22/68]
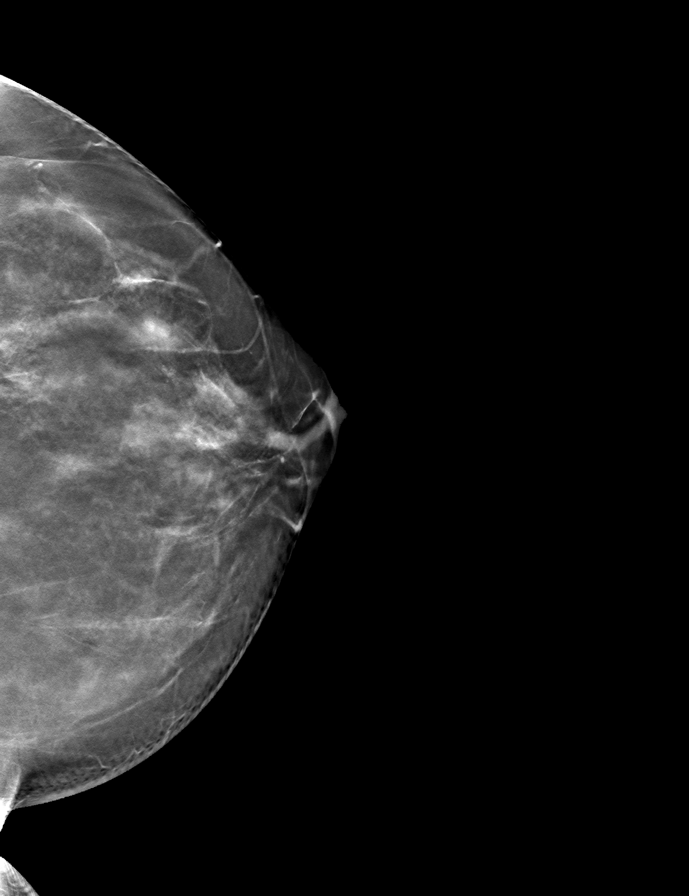
[frame 35/68]
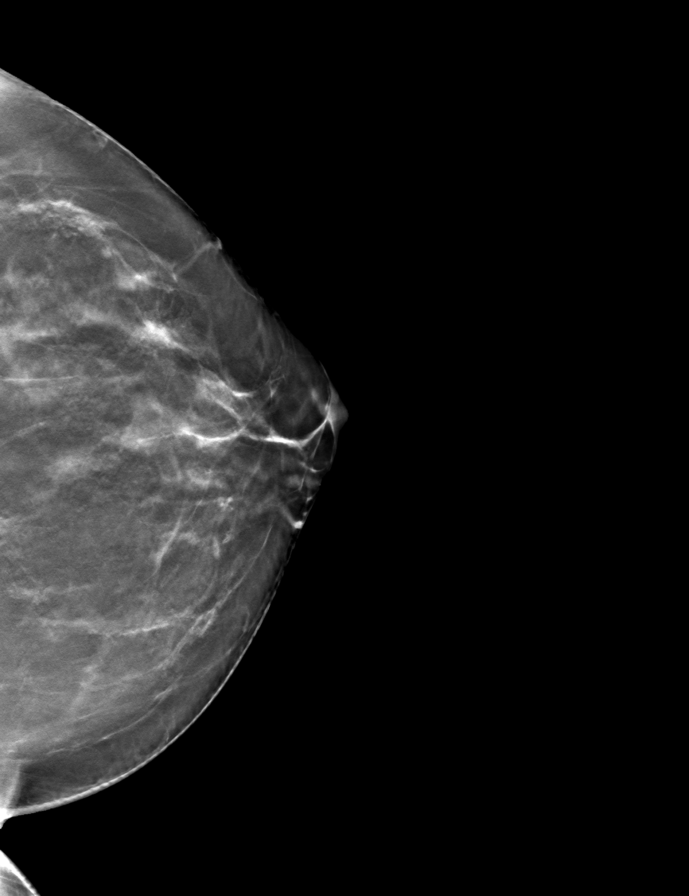

[R MLO tomo · tomo slice 36/71.0]
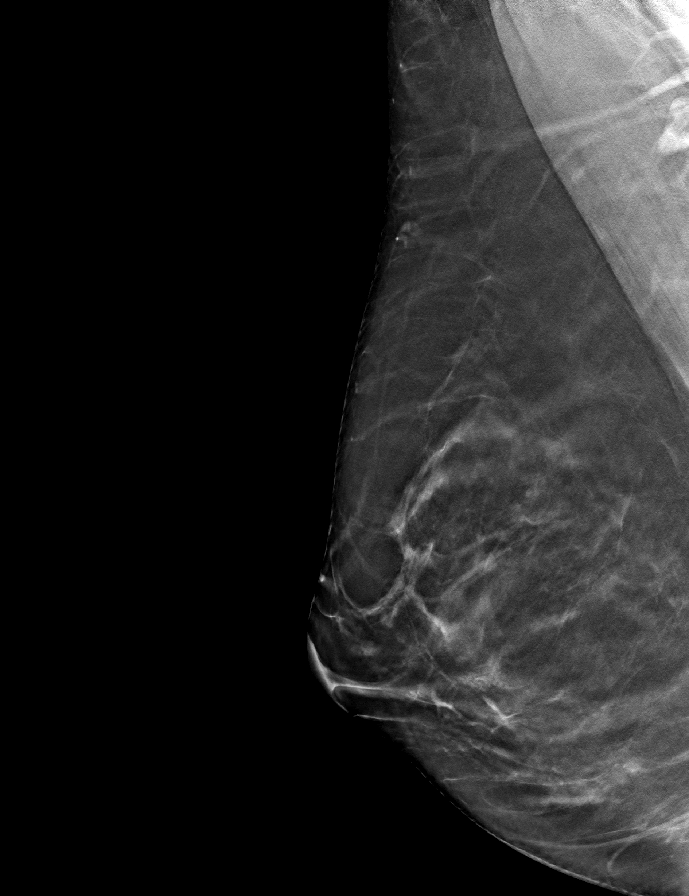

[L MLO tomo · tomo slice 36/71.0]
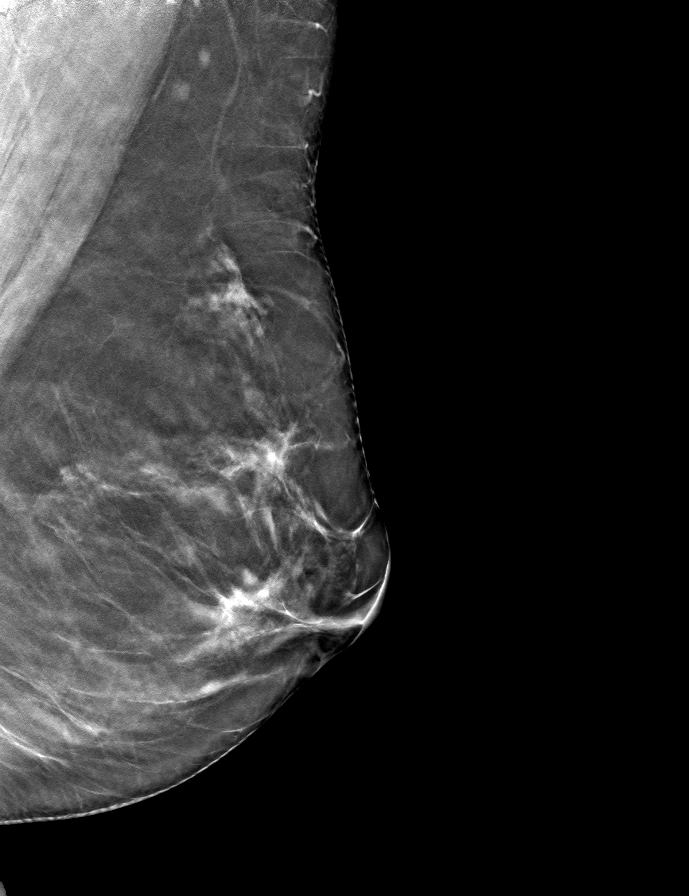

[R CC tomo · tomo slice 36/71.0]
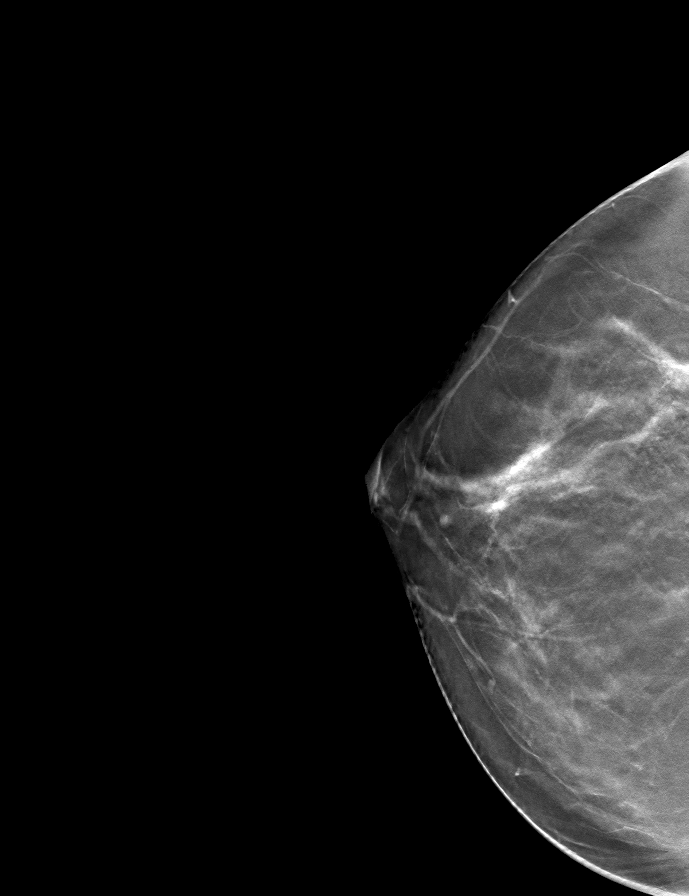

[9 of 24 positions shown; findings below may reference images not displayed]

ACR Breast Density Category b: There are scattered areas of
fibroglandular density.
FINDINGS: There are no findings suspicious for malignancy. Images were
processed with CAD.
IMPRESSION: No mammographic evidence of malignancy. A result letter of this
screening mammogram will be mailed directly to the patient.

RECOMMENDATION:
Screening mammogram in one year. (Code:CN-U-775)

BI-RADS CATEGORY  1: Negative.

## 2022-01-16 ENCOUNTER — Telehealth: Payer: 59 | Admitting: Physician Assistant

## 2022-01-16 ENCOUNTER — Other Ambulatory Visit (HOSPITAL_COMMUNITY): Payer: Self-pay

## 2022-01-16 DIAGNOSIS — J329 Chronic sinusitis, unspecified: Secondary | ICD-10-CM

## 2022-01-16 MED ORDER — AMOXICILLIN-POT CLAVULANATE 875-125 MG PO TABS
1.0000 | ORAL_TABLET | Freq: Two times a day (BID) | ORAL | 0 refills | Status: AC
  Filled 2022-01-16: qty 14, 7d supply, fill #0

## 2022-01-16 NOTE — Progress Notes (Signed)
We are sorry that you are not feeling well.  Here is how we plan to help!  Based on what you have shared with me it looks like you have sinusitis.  Sinusitis is inflammation and infection in the sinus cavities of the head.  Based on your presentation I believe you most likely have Acute Viral Sinusitis.This is an infection most likely caused by a virus. There is not specific treatment for viral sinusitis other than to help you with the symptoms until the infection runs its course.  You may use an oral decongestant such as Mucinex D or if you have glaucoma or high blood pressure use plain Mucinex. Saline nasal spray help and can safely be used as often as needed for congestion.  Sometimes viral sinus infections can progress into bacterial sinus infections. Usually this only occurs after symptoms have been present for 1 week or if you develop a fever. If your symptoms persist past 7 days or if you develop a fever, I have prescribed an antibiotic, Augmentin, to take twice daily for your symptoms. Please monitor symptoms and do not fill the prescription unless your symptoms persist or progress.   Some authorities believe that zinc sprays or the use of Echinacea may shorten the course of your symptoms.  Sinus infections are not as easily transmitted as other respiratory infection, however we still recommend that you avoid close contact with loved ones, especially the very young and elderly.  Remember to wash your hands thoroughly throughout the day as this is the number one way to prevent the spread of infection!  Home Care: Only take medications as instructed by your medical team. Do not take these medications with alcohol. A steam or ultrasonic humidifier can help congestion.  You can place a towel over your head and breathe in the steam from hot water coming from a faucet. Avoid close contacts especially the very young and the elderly. Cover your mouth when you cough or sneeze. Always remember to wash  your hands.  Get Help Right Away If: You develop worsening fever or sinus pain. You develop a severe head ache or visual changes. Your symptoms persist after you have completed your treatment plan.  Make sure you Understand these instructions. Will watch your condition. Will get help right away if you are not doing well or get worse.  Your e-visit answers were reviewed by a board certified advanced clinical practitioner to complete your personal care plan.  Depending on the condition, your plan could have included both over the counter or prescription medications.  If there is a problem please reply  once you have received a response from your provider.  Your safety is important to [place].  If you have drug allergies check your prescription carefully.    You can use MyChart to ask questions about today's visit, request a non-urgent call back, or ask for a work or school excuse for 24 hours related to this e-Visit. If it has been greater than 24 hours you will need to follow up with your provider, or enter a new e-Visit to address those concerns.  You will get an e-mail in the next two days asking about your experience.  I hope that your e-visit has been valuable and will speed your recovery. Thank you for using e-visits.  Approximately 5 minutes was spent documenting and reviewing patient's chart.

## 2022-02-04 ENCOUNTER — Other Ambulatory Visit: Payer: Self-pay | Admitting: Gastroenterology

## 2022-02-04 DIAGNOSIS — Z1231 Encounter for screening mammogram for malignant neoplasm of breast: Secondary | ICD-10-CM

## 2022-03-29 ENCOUNTER — Ambulatory Visit
Admission: RE | Admit: 2022-03-29 | Discharge: 2022-03-29 | Disposition: A | Discharge status: Home / Self Care | Payer: 59 | Source: Ambulatory Visit | Attending: Gastroenterology | Admitting: Gastroenterology

## 2022-03-29 DIAGNOSIS — Z124 Encounter for screening for malignant neoplasm of cervix: Secondary | ICD-10-CM | POA: Diagnosis not present

## 2022-03-29 DIAGNOSIS — Z1231 Encounter for screening mammogram for malignant neoplasm of breast: Secondary | ICD-10-CM

## 2022-03-29 DIAGNOSIS — Z01419 Encounter for gynecological examination (general) (routine) without abnormal findings: Secondary | ICD-10-CM | POA: Diagnosis not present

## 2022-05-27 ENCOUNTER — Telehealth: Payer: Commercial Managed Care - PPO | Admitting: Physician Assistant

## 2022-05-27 DIAGNOSIS — R109 Unspecified abdominal pain: Secondary | ICD-10-CM

## 2022-05-27 DIAGNOSIS — R3989 Other symptoms and signs involving the genitourinary system: Secondary | ICD-10-CM

## 2022-05-27 NOTE — Progress Notes (Signed)
Message sent to patient requesting further input regarding current symptoms. Awaiting patient response.  

## 2022-05-27 NOTE — Progress Notes (Signed)
Honestly because just the pressure and pain and radiating into back -- sharper earlier, I feel your condition warrants further evaluation and I recommend that you be seen in a face to face visit. Just so you can get an exam and urinalysis/culture to make sure proper diagnosis and treatment are given.    NOTE: There will be NO CHARGE for this eVisit   If you are having a true medical emergency please call 911.      For an urgent face to face visit, [facility] has eight urgent care centers for your convenience:   NEW!! [facility] [place] [facility] [place] Get Driving Directions [id number] [address] [place], [place] [facility] [place] [facility] [place] Get Driving Directions [id number] [place], [place] [address]   [place] [facility]) Get Driving Directions [id number] [address], [place] [address]  [place] [facility] [place] ([place]) Get Driving Directions [id number] [address] [place],  [place]  [address]  [place] [facility] [place] [facility] - at [address] Get Driving Directions  [phone] [address] [place],  [place] [address]   [facility] Get Driving Directions [id number] [place], [place], [place] [address]   [place] [facility] Get Driving Directions  [id number] [address] [place],  [address]   [facility] at  Get Driving Directions [id number] [address], [place],  [address]  Your MyChart E-visit questionnaire answers were reviewed by a board certified advanced clinical practitioner to complete your personal care plan based on your specific symptoms.  Thank you for using e-Visits.

## 2022-08-30 ENCOUNTER — Ambulatory Visit: Payer: Self-pay | Admitting: Family Medicine

## 2022-09-19 DIAGNOSIS — H5213 Myopia, bilateral: Secondary | ICD-10-CM | POA: Diagnosis not present

## 2022-09-19 DIAGNOSIS — H524 Presbyopia: Secondary | ICD-10-CM | POA: Diagnosis not present

## 2022-10-17 ENCOUNTER — Telehealth: Payer: Commercial Managed Care - PPO | Admitting: Nurse Practitioner

## 2022-10-17 DIAGNOSIS — R3989 Other symptoms and signs involving the genitourinary system: Secondary | ICD-10-CM

## 2022-10-17 MED ORDER — NITROFURANTOIN MONOHYD MACRO 100 MG PO CAPS: 100.0000 mg | ORAL_CAPSULE | Freq: Two times a day (BID) | ORAL | 0 refills | Status: AC

## 2022-10-17 NOTE — Progress Notes (Signed)

## 2022-10-30 DIAGNOSIS — R351 Nocturia: Secondary | ICD-10-CM | POA: Diagnosis not present

## 2022-10-30 DIAGNOSIS — R3989 Other symptoms and signs involving the genitourinary system: Secondary | ICD-10-CM | POA: Diagnosis not present

## 2022-10-30 DIAGNOSIS — R3914 Feeling of incomplete bladder emptying: Secondary | ICD-10-CM | POA: Diagnosis not present

## 2022-11-28 ENCOUNTER — Ambulatory Visit: Payer: Self-pay | Admitting: Family Medicine

## 2022-12-02 ENCOUNTER — Ambulatory Visit: Payer: Commercial Managed Care - PPO | Admitting: Obstetrics and Gynecology

## 2022-12-16 ENCOUNTER — Ambulatory Visit: Payer: Commercial Managed Care - PPO | Admitting: Obstetrics and Gynecology

## 2022-12-30 ENCOUNTER — Other Ambulatory Visit (HOSPITAL_COMMUNITY): Payer: Self-pay

## 2022-12-30 ENCOUNTER — Ambulatory Visit: Payer: Commercial Managed Care - PPO | Admitting: Obstetrics and Gynecology

## 2022-12-30 ENCOUNTER — Encounter: Payer: Self-pay | Admitting: Obstetrics and Gynecology

## 2022-12-30 VITALS — BP 124/84 | HR 65 | Ht 67.75 in | Wt 182.0 lb

## 2022-12-30 DIAGNOSIS — N814 Uterovaginal prolapse, unspecified: Secondary | ICD-10-CM

## 2022-12-30 DIAGNOSIS — N951 Menopausal and female climacteric states: Secondary | ICD-10-CM

## 2022-12-30 DIAGNOSIS — N393 Stress incontinence (female) (male): Secondary | ICD-10-CM | POA: Diagnosis not present

## 2022-12-30 DIAGNOSIS — N3281 Overactive bladder: Secondary | ICD-10-CM

## 2022-12-30 DIAGNOSIS — R350 Frequency of micturition: Secondary | ICD-10-CM

## 2022-12-30 LAB — POCT URINALYSIS DIPSTICK
Bilirubin, UA: NEGATIVE
Blood, UA: NEGATIVE
Glucose, UA: NEGATIVE
Ketones, UA: NEGATIVE
Leukocytes, UA: NEGATIVE
Nitrite, UA: NEGATIVE
Protein, UA: NEGATIVE
Spec Grav, UA: 1.025 (ref 1.010–1.025)
Urobilinogen, UA: 0.2 E.U./dL
pH, UA: 6 (ref 5.0–8.0)

## 2022-12-30 MED ORDER — OXYBUTYNIN CHLORIDE ER 5 MG PO TB24
5.0000 mg | ORAL_TABLET | Freq: Every day | ORAL | 5 refills | Status: DC
  Filled 2022-12-30: qty 30, 30d supply, fill #0

## 2022-12-30 NOTE — Patient Instructions (Signed)
Today we talked about ways to manage bladder urgency such as altering your diet to avoid irritative beverages and foods (bladder diet) as well as attempting to decrease stress and other exacerbating factors.  You have a stage 1 (out of 4) prolapse.  We discussed the fact that it is not life threatening but there are several treatment options. For treatment of pelvic organ prolapse, we discussed options for management including expectant management, conservative management, and surgical management, such as Kegels, a pessary, pelvic floor physical therapy, and specific surgical procedures.   You can also chew a plain Tums 1-3 times per day to make your urine less acidic, especially if you have eating/drinking acidic things.   There is a website with helpful information for people with bladder irritation, called the IC Network at https://www.ic-network.com. This website has more information about a healthy bladder diet and patient forums for support.  The Most Bothersome Foods* The Least Bothersome Foods*  Coffee - Regular & Decaf Tea - caffeinated Carbonated beverages - cola, non-colas, diet & caffeine-free Alcohols - Beer, Red Wine, White Wine, [address] - Grapefruit, [place], Orange, [employer] - Cranberry, Grapefruit, Orange, Pineapple Vegetables - Tomato & Tomato Products Flavor Enhancers - Hot peppers, Spicy foods, Chili, Horseradish, Vinegar, Monosodium glutamate (MSG) Artificial Sweeteners - NutraSweet, Sweet 'N Low, Equal (sweetener), Saccharin Ethnic foods - [place], [place], [place] food [employer] - low-fat & whole Fruits - Bananas, Blueberries, Honeydew melon, Pears, Raisins, Watermelon Vegetables - Broccoli, [address] Sprouts, [place], Carrots, Cauliflower, Bath, Cucumber, Mushrooms, Peas, Radishes, Squash, Zucchini, White potatoes, Sweet potatoes & yams Poultry - Chicken, Eggs, [place], [employer] - Beef, Diplomatic Services operational officer, Lamb Seafood - Shrimp, [place] fish, Salmon Grains - Oat,  Rice Snacks - Pretzels, Popcorn  *Lenward Chancellor et al. Diet and its role in interstitial cystitis/bladder pain syndrome (IC/BPS) and comorbid conditions. [employer]. BJU Int. 2012 Jan 11.

## 2022-12-30 NOTE — Progress Notes (Signed)
[facility] New Patient Evaluation and Consultation  Referring Provider: Myrlene Broker, * PCP: Patient, No Pcp Per Date of Service: 12/30/2022  SUBJECTIVE Chief Complaint: New Patient (Initial Visit) (Tiffany Henderson is a 47 y.o. female here for a consult for prolapse./)  History of Present Illness: Tiffany Henderson is a 48 y.o. White or Caucasian female seen in consultation at the request of Dr. Okey Dupre for evaluation of Prolapse.    Review of records significant for: Virtual visits for UTI, no cultures done.   Urinary Symptoms: Leaks urine with cough/ sneeze and with urgency Leaks when sneezing Pad use: None Patient is not bothered by UI symptoms.  Day time voids 8-12.  Nocturia: 3 times per night to void. Voiding dysfunction:  empties bladder well.  Patient does not use a catheter to empty bladder.  When urinating, patient feels she has no difficulties Drinks: Physiological scientist daily, 2 cans of [employer] and Lemonade per day  UTIs: 3 UTI's in the last year.   Denies history of blood in urine, kidney or bladder stones, pyelonephritis, bladder cancer, and kidney cancer  Pelvic Organ Prolapse Symptoms:                  Patient Admits to a feeling of a bulge the vaginal area. It has been present for 1 years.  Patient Admits to seeing a bulge.  This bulge is bothersome.  Bowel Symptom: Bowel movements: 3-4 time(s) per week Stool consistency: soft  Straining: yes.  Splinting: yes.  Incomplete evacuation: yes.  Patient Denies accidental bowel leakage / fecal incontinence Bowel regimen: fiber, stool softener, and miralax HM Colonoscopy          Colonoscopy (Every 5 Years) Next due on 03/11/2024    03/12/2019  COLONOSCOPY   Only the first 1 history entries have been loaded, but more history exists.            Sexual Function Sexually active: no.  Sexual orientation: Straight Pain with sex: Yes, deep in the pelvis, has discomfort due to  dryness  Pelvic Pain Denies pelvic pain    Past Medical History:  Past Medical History:  Diagnosis Date   Abnormal Pap smear    History of shingles      Past Surgical History:   Past Surgical History:  Procedure Laterality Date   COLPOSCOPY     DILATION AND CURETTAGE OF UTERUS       Past OB/GYN History: [id number] Vaginal deliveries: 2,  Forceps/ Vacuum deliveries: 0, Cesarean section: 0 Menopausal: No, LMP Patient's last menstrual period was 12/16/2022. Contraception: None Last pap smear was Sept 2023.  Any history of abnormal pap smears: no.    Medications: Patient has a current medication list which includes the following prescription(s): oxybutynin.   Allergies: Patient has No Known Allergies.   Social History:  Social History   Tobacco Use   Smoking status: Never   Smokeless tobacco: Never  Vaping Use   Vaping status: Never Used  Substance Use Topics   Alcohol use: No   Drug use: No    Relationship status: married Patient lives with husband and children.   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 Regular exercise: No History of abuse: No  Family History:   Family History  Problem Relation Age of Onset   Hyperlipidemia Mother    Cancer Mother        breast '04 survivor   Hypertension Mother    Breast cancer Mother  42   Colon cancer Mother 37   Hypertension Father    Arthritis Father    Asthma Father    Hypertension Maternal Grandmother    Breast cancer Maternal Grandmother 7   Liver cancer Maternal Grandmother    Heart attack Maternal Grandfather    Birth defects Cousin        Down's   Colon polyps Neg Hx    Esophageal cancer Neg Hx    Rectal cancer Neg Hx    Stomach cancer Neg Hx      Review of Systems: Review of Systems  Constitutional:  Positive for malaise/fatigue. Negative for chills and fever.       +Weight Gain  Respiratory:  Negative for cough and shortness of breath.   Cardiovascular:  Positive  for palpitations. Negative for chest pain.  Gastrointestinal:  Negative for abdominal pain, blood in stool, constipation and diarrhea.  Genitourinary:        +Vaginal D/c  Skin:  Negative for rash.  Neurological:  Positive for dizziness and headaches. Negative for weakness.  Endo/Heme/Allergies:        +Hot Flashes  Psychiatric/Behavioral:  Negative for depression and suicidal ideas. The patient is nervous/anxious.      OBJECTIVE Physical Exam: Vitals:   12/30/22 1309  BP: 124/84  Pulse: 65  Weight: 182 lb (82.6 kg)  Height: 5' 7.75" (1.721 m)    Physical Exam Constitutional:      Appearance: Normal appearance.  Pulmonary:     Effort: Pulmonary effort is normal.  Abdominal:     Palpations: Abdomen is soft.  Neurological:     General: No focal deficit present.     Mental Status: She is alert and oriented to person, place, and time.  Psychiatric:        Mood and Affect: Mood normal.        Behavior: Behavior normal. Behavior is cooperative.        Thought Content: Thought content normal.      GU / Detailed Urogynecologic Evaluation:  Pelvic Exam: Normal external female genitalia; Bartholin's and Skene's glands normal in appearance; urethral meatus normal in appearance, no urethral masses or discharge.   CST: negative  Speculum exam reveals normal vaginal mucosa with atrophy. Cervix normal appearance. Uterus normal single, nontender. Adnexa normal adnexa.     With apex supported, anterior compartment defect was reduced  Pelvic floor strength I/V  Pelvic floor musculature: Right levator tender, Right obturator tender, Left levator non-tender, Left obturator non-tender  POP-Q:   POP-Q  -1.5                                            Aa   -1.5                                           Ba  -2 (Standing)                                              C   3.5  Gh  2.5                                            Pb  10                                             tvl   -2                                            Ap  -2                                            Bp  -9.5                                              D      Rectal Exam:  Normal external exam  Post-Void Residual (PVR) by Bladder Scan: In order to evaluate bladder emptying, we discussed obtaining a postvoid residual and patient agreed to this procedure.  Procedure: The ultrasound unit was placed on the patient's abdomen in the suprapubic region after the patient had voided.    Post Void Residual - 12/30/22 1317       Post Void Residual   Post Void Residual 17 mL              Laboratory Results: Lab Results  Component Value Date   COLORU Yellow 12/30/2022   CLARITYU Clear 12/30/2022   GLUCOSEUR Negative 12/30/2022   BILIRUBINUR Negative 12/30/2022   KETONESU Negative 12/30/2022   SPECGRAV 1.025 12/30/2022   RBCUR Negative 12/30/2022   PHUR 6.0 12/30/2022   PROTEINUR Negative 12/30/2022   UROBILINOGEN 0.2 12/30/2022   LEUKOCYTESUR Negative 12/30/2022    Lab Results  Component Value Date   CREATININE 0.72 09/21/2020   CREATININE 0.7 09/19/2008    Lab Results  Component Value Date   HGB 13.2 09/21/2020     ASSESSMENT AND PLAN Ms. Zupko is a 47 y.o. with:  1. Uterine prolapse   2. Urinary frequency   3. SUI (stress urinary incontinence, female)   4. Overactive bladder   5. Menopausal syndrome (hot flashes)    Patient has stage I/IV Anterior, Stage I/IV Uterine, and stage I/IV posterior vaginal prolapse. She is symptomatic from the uterine prolapse as she reports discomfort when walking and states she can feel it shift internally, especially during a bowel movement. She reports she would prefer a surgical repair. Will have her meet with surgeon for surgical consult. Briefly reviewed options for repair and she is leaning more towards a sacrocolpopexy.  We discussed the symptoms of overactive bladder  (OAB), which include urinary urgency, urinary frequency, nocturia, with or without urge incontinence.  While we do not know the exact etiology of OAB, several treatment options exist. We discussed management including behavioral therapy (decreasing bladder irritants, urge suppression strategies, timed voids, bladder retraining), physical therapy, medication; for refractory cases  posterior tibial nerve stimulation, sacral neuromodulation, and intravesical botulinum toxin injection. For anticholinergic medications, we discussed the potential side effects of anticholinergics including dry eyes, dry mouth, constipation, cognitive impairment and urinary retention.For Beta-3 agonist medication, we discussed the potential side effect of elevated blood pressure which is more likely to occur in individuals with uncontrolled hypertension. Patient reports she has significant leaking with cough and sneeze. Will need a simple CMG as CST was negative today on exam.  Patient reports significant overactive bladder with uregency and frequency. Will start her on Oxybutynin 5mg  daily and see how she does with this.  Patient is reporting symptoms of peri-menopause with hot flashes and nightsweats. We discussed that we do not routinely manage oral and systemic HRT and she should consult with a PCP. She also is having palpitations and has not had her blood drawn to check thyroid levels. Encouraged her to establish with a PCP for labwork and management of HRT if she desires. Offered to start vaginal estrogen but we discussed that would only help with the vaginal dryness.  Patient to follow up for surgical consult and simple CMG.    Selmer Dominion, NP

## 2023-01-07 ENCOUNTER — Other Ambulatory Visit (HOSPITAL_COMMUNITY): Payer: Self-pay

## 2023-01-07 ENCOUNTER — Telehealth: Payer: Self-pay | Admitting: Obstetrics and Gynecology

## 2023-01-07 MED ORDER — OXYBUTYNIN CHLORIDE ER 15 MG PO TB24
15.0000 mg | ORAL_TABLET | Freq: Every day | ORAL | 5 refills | Status: DC
  Filled 2023-01-07: qty 30, 30d supply, fill #0
  Filled 2023-02-17: qty 30, 30d supply, fill #1

## 2023-01-07 NOTE — Telephone Encounter (Signed)
Oxybutynin 15mg  sent in for patient. She will need to monitor for dry mouth, dry eyes, and constipation and take this medication 1 times per day

## 2023-01-07 NOTE — Telephone Encounter (Signed)
From patient - prescribed me some pills for my bladder, just 5 mg. I have been taking them one in the morning and one at night. Those pills really have helped me. She said they go up to 15 mg. I think I want to try those pills before having this surgery. Could you see if she can send in Oxybutynim 15mg  to [facility]? I'd rather do the pills for right now vs the surgery.  Thanks!!!!

## 2023-01-10 ENCOUNTER — Ambulatory Visit: Payer: Commercial Managed Care - PPO | Admitting: Obstetrics

## 2023-02-13 ENCOUNTER — Other Ambulatory Visit: Payer: Self-pay | Admitting: Gastroenterology

## 2023-02-13 DIAGNOSIS — Z1231 Encounter for screening mammogram for malignant neoplasm of breast: Secondary | ICD-10-CM

## 2023-02-17 ENCOUNTER — Other Ambulatory Visit: Payer: Self-pay

## 2023-02-18 ENCOUNTER — Other Ambulatory Visit: Payer: Self-pay

## 2023-02-18 MED ORDER — OXYBUTYNIN CHLORIDE ER 15 MG PO TB24: 15.0000 mg | ORAL_TABLET | Freq: Every day | ORAL | 5 refills | Status: DC

## 2023-03-11 ENCOUNTER — Encounter: Payer: Self-pay | Admitting: Obstetrics and Gynecology

## 2023-03-11 DIAGNOSIS — Z6828 Body mass index (BMI) 28.0-28.9, adult: Secondary | ICD-10-CM

## 2023-03-31 ENCOUNTER — Telehealth: Payer: Commercial Managed Care - PPO | Admitting: Family Medicine

## 2023-03-31 DIAGNOSIS — J069 Acute upper respiratory infection, unspecified: Secondary | ICD-10-CM

## 2023-03-31 MED ORDER — IPRATROPIUM BROMIDE 0.03 % NA SOLN: 2.0000 | Freq: Two times a day (BID) | NASAL | 0 refills | Status: DC

## 2023-03-31 NOTE — Progress Notes (Signed)

## 2023-04-04 ENCOUNTER — Ambulatory Visit
Admission: RE | Admit: 2023-04-04 | Discharge: 2023-04-04 | Disposition: A | Discharge status: Home / Self Care | Payer: Commercial Managed Care - PPO | Source: Ambulatory Visit | Attending: Gastroenterology

## 2023-04-04 DIAGNOSIS — Z1231 Encounter for screening mammogram for malignant neoplasm of breast: Secondary | ICD-10-CM | POA: Diagnosis not present

## 2023-04-11 ENCOUNTER — Ambulatory Visit: Payer: Commercial Managed Care - PPO

## 2023-05-06 ENCOUNTER — Encounter: Payer: Self-pay | Admitting: Gastroenterology

## 2023-05-19 ENCOUNTER — Encounter: Payer: Self-pay | Admitting: Obstetrics and Gynecology

## 2023-05-20 ENCOUNTER — Other Ambulatory Visit: Payer: Self-pay

## 2023-05-20 ENCOUNTER — Other Ambulatory Visit (HOSPITAL_COMMUNITY): Payer: Self-pay

## 2023-05-20 MED ORDER — OXYBUTYNIN CHLORIDE ER 15 MG PO TB24
15.0000 mg | ORAL_TABLET | Freq: Every day | ORAL | 5 refills | Status: DC
  Filled 2023-05-20: qty 30, 30d supply, fill #0

## 2023-10-08 DIAGNOSIS — Z130 Encounter for screening for diseases of the blood and blood-forming organs and certain disorders involving the immune mechanism: Secondary | ICD-10-CM | POA: Diagnosis not present

## 2023-10-08 DIAGNOSIS — Z1329 Encounter for screening for other suspected endocrine disorder: Secondary | ICD-10-CM | POA: Diagnosis not present

## 2023-10-08 DIAGNOSIS — Z13220 Encounter for screening for lipoid disorders: Secondary | ICD-10-CM | POA: Diagnosis not present

## 2023-10-08 DIAGNOSIS — Z01419 Encounter for gynecological examination (general) (routine) without abnormal findings: Secondary | ICD-10-CM | POA: Diagnosis not present

## 2023-10-15 NOTE — Progress Notes (Unsigned)
   LILLETTE Ileana Collet, PhD, LAT, ATC acting as a scribe for Artist Lloyd, MD.  Tiffany Henderson is a 48 y.o. female who presents to [facility] at [facility] today for L knee pain x ***. Pt locates pain to ***  L Knee swelling: Mechanical symptoms: Aggravates: Treatments tried:  Pt also c/o R heel pain x ***. Pt locates pain to ***  Pertinent review of systems: ***  Relevant historical information: ***   Exam:  There were no vitals taken for this visit. General: Well Developed, well nourished, and in no acute distress.   MSK: ***    Lab and Radiology Results No results found for this or any previous visit (from the past 72 hours). No results found.     Assessment and Plan: 48 y.o. female with ***   PDMP not reviewed this encounter. No orders of the defined types were placed in this encounter.  No orders of the defined types were placed in this encounter.    Discussed warning signs or symptoms. Please see discharge instructions. Patient expresses understanding.   ***

## 2023-10-16 ENCOUNTER — Encounter: Payer: Self-pay | Admitting: Family Medicine

## 2023-10-16 ENCOUNTER — Ambulatory Visit: Admitting: Family Medicine

## 2023-10-16 ENCOUNTER — Other Ambulatory Visit: Payer: Self-pay

## 2023-10-16 ENCOUNTER — Ambulatory Visit (INDEPENDENT_AMBULATORY_CARE_PROVIDER_SITE_OTHER)

## 2023-10-16 VITALS — BP 102/80 | HR 68 | Ht 67.75 in | Wt 182.0 lb

## 2023-10-16 DIAGNOSIS — M79671 Pain in right foot: Secondary | ICD-10-CM

## 2023-10-16 DIAGNOSIS — M25562 Pain in left knee: Secondary | ICD-10-CM | POA: Diagnosis not present

## 2023-10-16 DIAGNOSIS — G8929 Other chronic pain: Secondary | ICD-10-CM

## 2023-10-16 DIAGNOSIS — M25462 Effusion, left knee: Secondary | ICD-10-CM | POA: Diagnosis not present

## 2023-10-16 NOTE — Patient Instructions (Addendum)
 Thank you for coming in today.   Please get an Xray today before you leave   You received an injection today. Seek immediate medical attention if the joint becomes red, extremely painful, or is oozing fluid.   Arch straps  Ice massage  Gel heel cushion  Night splints  Please work on the home exercises the athletic trainer went over with you:  View at [email or web address] code [id number]  Check back as needed  Reminder: Dr. Joane will be out of the office starting August 1st, for about 6 weeks

## 2023-10-20 ENCOUNTER — Ambulatory Visit: Payer: Self-pay | Admitting: Family Medicine

## 2023-10-20 NOTE — Progress Notes (Signed)
 Left knee x-ray shows a little bit of arthritis at the knee joint.

## 2023-11-05 NOTE — Telephone Encounter (Signed)
 Lets try to get her back in for re-evaluation, given the new injury while playing Volleyball.

## 2023-11-06 ENCOUNTER — Encounter: Payer: Self-pay | Admitting: Family Medicine

## 2023-11-06 ENCOUNTER — Ambulatory Visit: Admitting: Family Medicine

## 2023-11-06 ENCOUNTER — Other Ambulatory Visit: Payer: Self-pay

## 2023-11-06 VITALS — BP 110/80 | HR 70 | Ht 67.75 in | Wt 180.0 lb

## 2023-11-06 DIAGNOSIS — G8929 Other chronic pain: Secondary | ICD-10-CM

## 2023-11-06 DIAGNOSIS — M25562 Pain in left knee: Secondary | ICD-10-CM | POA: Diagnosis not present

## 2023-11-06 MED ORDER — MELOXICAM 15 MG PO TABS: 15.0000 mg | ORAL_TABLET | Freq: Every day | ORAL | 0 refills | Status: DC

## 2023-11-06 NOTE — Patient Instructions (Signed)
 Thank you for coming in today.   We are ordering an MRI for you today.  The imaging office will be calling you to schedule your appointment after we obtain authorization from your insurance company.  If you have not heard from [facility] within a week, please call [phone] to schedule your MRI.   Please be sure you have signed up for MyChart so that we can get your results to you.  We will be in touch with you as soon as we can.  Please know, it can take up to 3-4 business days for the radiologist and Dr. Joane to have time to review the results and determine the best plan of care.  If there is something that appears to be surgical or needs a referral to other specialists we will let you know through MyChart or telephone.  Otherwise we will plan to schedule a follow up appointment with Dr. Joane once we have the results.    Rx sent in for Meloxicam .   Anticipate a referral to Dr. Genelle based on MRI results.   See you back as needed or for MRI review.

## 2023-11-06 NOTE — Progress Notes (Signed)
   I, Leotis Batter, CMA acting as a scribe for Artist Lloyd, MD.  Tiffany Henderson is a 48 y.o. female who presents to [facility] at [facility] today for L knee pain. Pt was last seen by Dr. Lloyd on 10/16/23 and was given a L knee steroid injection.  Today, pt reports improvement of knee pain with steroid injections. Was playing [place] with her daughter last week and possible twisted the knee wrong. Feels very tight. Sx worse with ambulation. Feels unsteady. Locates pain to medial aspect of the leg. Difficulty with full flexion. Has tried ice, BioFreeze, and Voltaren Gel. The medial aspect of the knee is tender to palpation.   Swelling: present Treatments tried: ice, Voltaren, BioFreeze  Dx imaging: 10/16/23 L knee XR  Pertinent review of systems: No fevers or chills  Relevant historical information: Otherwise healthy   Exam:  BP 110/80   Pulse 70   Ht 5' 7.75 (1.721 m)   Wt 180 lb (81.6 kg)   LMP 09/25/2023   SpO2 98%   BMI 27.57 kg/m  General: Well Developed, well nourished, and in no acute distress.   MSK: Left knee moderate effusion.  Tender palpation medial joint line. Positive McMurray's test.     Assessment and Plan: 48 y.o. female with left knee pain.  Patient was seen recently for knee pain and had a steroid injection which worked well.  X-ray showed only minimal degenerative changes.  Today symptoms are consistent with a meniscus tear.  She does have mechanical symptoms.  Plan for MRI with anticipated referral to orthopedic surgery likely Dr. Genelle.    PDMP not reviewed this encounter. Orders Placed This Encounter  Procedures   MR KNEE LEFT WO CONTRAST    Standing Status:   Future    Expiration Date:   12/07/2023    What is the patient's sedation requirement?:   No Sedation    Does the patient have a pacemaker or implanted devices?:   No    Preferred imaging location?:   [facility] (table limit-550lbs)   Meds ordered this  encounter  Medications   meloxicam  (MOBIC ) 15 MG tablet    Sig: Take 1 tablet (15 mg total) by mouth daily.    Dispense:  30 tablet    Refill:  0     Discussed warning signs or symptoms. Please see discharge instructions. Patient expresses understanding.   The above documentation has been reviewed and is accurate and complete Artist Lloyd, M.D.

## 2023-11-10 ENCOUNTER — Encounter: Payer: Self-pay | Admitting: Family Medicine

## 2023-11-12 ENCOUNTER — Ambulatory Visit
Admission: RE | Admit: 2023-11-12 | Discharge: 2023-11-12 | Disposition: A | Discharge status: Home / Self Care | Source: Ambulatory Visit | Attending: Family Medicine | Admitting: Family Medicine

## 2023-11-12 DIAGNOSIS — M25462 Effusion, left knee: Secondary | ICD-10-CM | POA: Diagnosis not present

## 2023-11-12 DIAGNOSIS — M1712 Unilateral primary osteoarthritis, left knee: Secondary | ICD-10-CM | POA: Diagnosis not present

## 2023-11-12 DIAGNOSIS — G8929 Other chronic pain: Secondary | ICD-10-CM

## 2023-11-12 DIAGNOSIS — M23222 Derangement of posterior horn of medial meniscus due to old tear or injury, left knee: Secondary | ICD-10-CM | POA: Diagnosis not present

## 2023-11-13 ENCOUNTER — Encounter: Payer: Self-pay | Admitting: Family Medicine

## 2023-11-13 DIAGNOSIS — G8929 Other chronic pain: Secondary | ICD-10-CM

## 2023-11-13 DIAGNOSIS — S83242D Other tear of medial meniscus, current injury, left knee, subsequent encounter: Secondary | ICD-10-CM

## 2023-11-15 ENCOUNTER — Other Ambulatory Visit

## 2023-11-17 ENCOUNTER — Other Ambulatory Visit (HOSPITAL_COMMUNITY): Payer: Self-pay

## 2023-11-27 ENCOUNTER — Ambulatory Visit: Payer: Self-pay | Admitting: Family Medicine

## 2023-11-27 NOTE — Progress Notes (Signed)
 Left knee MRI shows a complex meniscus tear and areas of severe arthritis and a significant amount of bone bruising.  The arthritis is going to limit what can be done.  Recommend return to clinic to go over the results in full detail and discuss treatment plan and options.

## 2023-11-28 DIAGNOSIS — S83242A Other tear of medial meniscus, current injury, left knee, initial encounter: Secondary | ICD-10-CM | POA: Diagnosis not present

## 2023-12-03 ENCOUNTER — Encounter (HOSPITAL_BASED_OUTPATIENT_CLINIC_OR_DEPARTMENT_OTHER): Payer: Self-pay | Admitting: Orthopaedic Surgery

## 2023-12-03 ENCOUNTER — Other Ambulatory Visit: Payer: Self-pay

## 2023-12-09 NOTE — H&P (Signed)
 PREOPERATIVE H&P  Chief Complaint: medial meniscus tear of left knee  HPI: Tiffany Henderson is a 48 y.o. female who is scheduled for, Procedure(s): ARTHROSCOPY, KNEE, WITH MEDIAL MENISCECTOMY ARTHROSCOPY, KNEE, WITH MENISCUS REPAIR.   She is a 27 year old friend of Lorrene from the [facility], who had a left knee injury three months ago. 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 She was playing volleyball and twisted her knee. She had immediate pain on the inside of the knee. She had no previous injuries. She saw Dr.Corey who got an MRI and sent her to me for further evaluation. She continues to have trouble with he knee.  She has catching and mechanical symptoms. She has tried to brace.   Symptoms are rated as moderate to severe, and have been worsening.  This is significantly impairing activities of daily living.    Please see clinic note for further details on this patient's care.    She has elected for surgical management.   Past Medical History:  Diagnosis Date   Abnormal Pap smear    History of shingles    Past Surgical History:  Procedure Laterality Date   COLPOSCOPY     DILATION AND CURETTAGE OF UTERUS     Social History   Socioeconomic History   Marital status: Married    Spouse name: Not on file   Number of children: 2   Years of education: Not on file   Highest education level: Not on file  Occupational History   Not on file  Tobacco Use   Smoking status: Never   Smokeless tobacco: Never  Vaping Use   Vaping status: Never Used  Substance and Sexual Activity   Alcohol use: No   Drug use: No   Sexual activity: Yes    Birth control/protection: None  Other Topics Concern   Not on file  Social History Narrative   [employer]ried '[employer]- 4 years/divorced; married '10Work Gastro registrarNo abuse history   Highest level education - college   Social Drivers of Health   Financial Resource Strain: Not on file  Food  Insecurity: Not on file  Transportation Needs: Not on file  Physical Activity: Not on file  Stress: Not on file  Social Connections: Not on file   Family History  Problem Relation Age of Onset   Hyperlipidemia Mother    Cancer Mother        breast '04 survivor   Hypertension Mother    Breast cancer Mother 80   Colon cancer Mother 59   Hypertension Father    Arthritis Father    Asthma Father    Hypertension Maternal Grandmother    Breast cancer Maternal Grandmother 59   Liver cancer Maternal Grandmother    Heart attack Maternal Grandfather    Birth defects Cousin        Down's   Colon polyps Neg Hx    Esophageal cancer Neg Hx    Rectal cancer Neg Hx    Stomach cancer Neg Hx    No Known Allergies Prior to Admission medications   Medication Sig Start Date End Date Taking? Authorizing Provider  meloxicam  (MOBIC ) 15 MG tablet Take 1 tablet (15 mg total) by mouth daily. 11/06/23  Yes Joane Artist RAMAN, MD  oxybutynin  (DITROPAN  XL) 15 MG 24 hr tablet Take 1 tablet (15 mg total) by mouth daily. 05/20/23  Yes Zuber, Thomas J, MD  ipratropium (ATROVENT ) 0.03 % nasal spray Place  2 sprays into both nostrils every 12 (twelve) hours. 03/31/23   Moishe Chiquita HERO, NP    ROS: All other systems have been reviewed and were otherwise negative with the exception of those mentioned in the HPI and as above.  Physical Exam: General: Alert, no acute distress Cardiovascular: No pedal edema Respiratory: No cyanosis, no use of accessory musculature GI: No organomegaly, abdomen is soft and non-tender Skin: No lesions in the area of chief complaint Neurologic: Sensation intact distally Psychiatric: Patient is competent for consent with normal mood and affect Lymphatic: No axillary or cervical lymphadenopathy  MUSCULOSKELETAL:  The range of motion of the knee is 0-90 degrees. There is pain with deep flexion. Tender to palpation over the medial joint line. Moderate effusion.   Imaging: MRI demonstrates  a root type tear, radial component near the posterior root of the meniscus. There is perhaps some intrasubstance tearing a well. Bony edema in the tibia.   Assessment: medial meniscus tear of left knee  Plan: Plan for Procedure(s): ARTHROSCOPY, KNEE, WITH MEDIAL MENISCECTOMY ARTHROSCOPY, KNEE, WITH MENISCUS REPAIR  The risks benefits and alternatives were discussed with the patient including but not limited to the risks of nonoperative treatment, versus surgical intervention including infection, bleeding, nerve injury,  blood clots, cardiopulmonary complications, morbidity, mortality, among others, and they were willing to proceed.   The patient acknowledged the explanation, agreed to proceed with the plan and consent was signed.   Operative Plan: Left knee scope with medial meniscectomy versus repair Discharge Medications: standard DVT Prophylaxis: aspirin  Physical Therapy: outpatient Special Discharge needs: +/-   Aleck LOISE Stalling, PA-C  12/09/2023 8:12 AM

## 2023-12-10 NOTE — Discharge Instructions (Signed)
 Bonner Hair MD, MPH Aleck Stalling, PA-C [facility] [address] [phone] (tel)   [phone] (fax)   POST-OPERATIVE INSTRUCTIONS - Knee Arthroscopy  WOUND CARE - You may remove the Operative Dressing on Post-Op Day #3 (72hrs after surgery).   -  Alternatively if you would like you can leave dressing on until follow-up if within 7-8 days but keep it dry. - Leave steri-strips in place until they fall off on their own, usually 2 weeks postop. - An ACE wrap may be used to control swelling, do not wrap this too tight.  If the initial ACE wrap feels too tight you may loosen it. - There may be a small amount of fluid/bleeding leaking at the surgical site.  - This is normal; the knee is filled with fluid during the procedure and can leak for 24-48hrs after surgery. You may change/reinforce the bandage as needed.  - Use the Cryocuff or Ice as often as possible for the first 7 days, then as needed for pain relief. Always keep a towel, ACE wrap or other barrier between the cooling unit and your skin.  - You may shower on Post-Op Day #3. Gently pat the area dry.  - Do not soak the knee in water or submerge it.  - Do not go swimming in the pool or ocean until 4 weeks after surgery or when otherwise instructed.  Keep dry incisions as dry as possible.   BRACE/AMBULATION  -            You will not need a brace after this procedure.   - You may use crutches initially to help you weight bear, but this is not required - You can put full weight on your operative leg as you feel comfortable  PHYSICAL THERAPY - You will begin physical therapy soon after surgery (unless otherwise specified) - Please call to set up an appointment, if you do not already have one  - Let our office if there are any issues with scheduling your therapy    REGIONAL ANESTHESIA (NERVE BLOCKS) The anesthesia team may have performed a nerve block for you this is a great tool used to  minimize pain.   The block may start wearing off overnight (between 8-24 hours postop) When the block wears off, your pain may go from nearly zero to the pain you would have had postop without the block. This is an abrupt transition but nothing dangerous is happening.   This can be a challenging period but utilize your as needed pain medications to try and manage this period. We suggest you use the pain medication the first night prior to going to bed, to ease this transition.  You may take an extra dose of narcotic when this happens if needed   POST-OP MEDICATIONS- Multimodal approach to pain control In general your pain will be controlled with a combination of substances.  Prescriptions unless otherwise discussed are electronically sent to your pharmacy.  This is a carefully made plan we use to minimize narcotic use.     Meloxicam  - Anti-inflammatory medication taken on a scheduled basis Acetaminophen  - Non-narcotic pain medicine taken on a scheduled basis  Tramadol  - This is a strong narcotic, to be used only on an "as needed" basis for SEVERE pain. Aspirin  81mg  - This medicine is used to minimize the risk of blood clots after surgery. Zofran  - take as needed for nausea   FOLLOW-UP   Please call the office to schedule  a follow-up appointment for your incision check, 7-10 days post-operatively.   IF YOU HAVE ANY QUESTIONS, PLEASE FEEL FREE TO CALL OUR OFFICE.   HELPFUL INFORMATION   Keep your leg elevated to decrease swelling, which will then in turn decrease your pain. I would elevate the foot of your bed by putting a couple of couch pillows between your mattress and box spring. I would not keep pillow directly under your ankle.  - Do not sleep with a pillow behind your knee even if it is more comfortable as this may make it harder to get your knee fully straight long term.   There will be MORE swelling on days 1-3 than there is on the day of surgery.  This also is normal. The  swelling will decrease with the anti-inflammatory medication, ice and keeping it elevated. The swelling will make it more difficult to bend your knee. As the swelling goes down your motion will become easier   You may develop swelling and bruising that extends from your knee down to your calf and perhaps even to your foot over the next week. Do not be alarmed. This too is normal, and it is due to gravity   There may be some numbness adjacent to the incision site. This may last for 6-12 months or longer in some patients and is expected.   You may return to sedentary work/school in the next couple of days when you feel up to it. You will need to keep your leg elevated as much as possible    You should wean off your narcotic medicines as soon as you are able.  Most patients will be off narcotics before their first postop appointment.    We suggest you use the pain medication the first night prior to going to bed, in order to ease any pain when the anesthesia wears off. You should avoid taking pain medications on an empty stomach as it will make you nauseous.   Do not drink alcoholic beverages or take illicit drugs when taking pain medications.   It is against the law to drive while taking narcotics. You cannot drive if your Right leg is in brace locked in extension.   Pain medication may make you constipated.  Below are a few solutions to try in this order:  o Decrease the amount of pain medication if you aren't having pain.  o Drink lots of decaffeinated fluids.  o Drink prune juice and/or eat dried prunes   o If the first 3 don't work start with additional solutions  o Take Colace - an over-the-counter stool softener  o Take Senokot - an over-the-counter laxative  o Take Miralax - a stronger over-the-counter laxative    For more information including helpful videos and documents visit our website:   [email or web address]    No Tylenol  before  12:30pm No ibuprofen  4:00pm   Post Anesthesia Home Care Instructions  Activity: Get plenty of rest for the remainder of the day. A responsible individual must stay with you for 24 hours following the procedure.  For the next 24 hours, DO NOT: -Drive a car -Advertising copywriter -Drink alcoholic beverages -Take any medication unless instructed by your physician -Make any legal decisions or sign important papers.  Meals: Start with liquid foods such as gelatin or soup. Progress to regular foods as tolerated. Avoid greasy, spicy, heavy foods. If nausea and/or vomiting occur, drink only clear liquids until the nausea and/or vomiting subsides. Call your physician if vomiting continues.  Special  Instructions/Symptoms: Your throat may feel dry or sore from the anesthesia or the breathing tube placed in your throat during surgery. If this causes discomfort, gargle with warm salt water. The discomfort should disappear within 24 hours.

## 2023-12-11 ENCOUNTER — Ambulatory Visit (HOSPITAL_BASED_OUTPATIENT_CLINIC_OR_DEPARTMENT_OTHER): Admitting: Anesthesiology

## 2023-12-11 ENCOUNTER — Ambulatory Visit (HOSPITAL_BASED_OUTPATIENT_CLINIC_OR_DEPARTMENT_OTHER)
Admission: RE | Admit: 2023-12-11 | Discharge: 2023-12-11 | Disposition: A | Discharge status: Home / Self Care | Attending: Orthopaedic Surgery | Admitting: Orthopaedic Surgery

## 2023-12-11 ENCOUNTER — Other Ambulatory Visit: Payer: Self-pay

## 2023-12-11 ENCOUNTER — Encounter (HOSPITAL_BASED_OUTPATIENT_CLINIC_OR_DEPARTMENT_OTHER): Payer: Self-pay | Admitting: Orthopaedic Surgery

## 2023-12-11 ENCOUNTER — Encounter (HOSPITAL_BASED_OUTPATIENT_CLINIC_OR_DEPARTMENT_OTHER)
Admission: RE | Disposition: A | Discharge status: Home / Self Care | Payer: Self-pay | Source: Home / Self Care | Attending: Orthopaedic Surgery

## 2023-12-11 DIAGNOSIS — Z01818 Encounter for other preprocedural examination: Secondary | ICD-10-CM

## 2023-12-11 DIAGNOSIS — S83232A Complex tear of medial meniscus, current injury, left knee, initial encounter: Secondary | ICD-10-CM | POA: Insufficient documentation

## 2023-12-11 DIAGNOSIS — Y9368 Activity, volleyball (beach) (court): Secondary | ICD-10-CM | POA: Diagnosis not present

## 2023-12-11 DIAGNOSIS — X501XXA Overexertion from prolonged static or awkward postures, initial encounter: Secondary | ICD-10-CM | POA: Diagnosis not present

## 2023-12-11 DIAGNOSIS — S83242A Other tear of medial meniscus, current injury, left knee, initial encounter: Secondary | ICD-10-CM | POA: Diagnosis not present

## 2023-12-11 HISTORY — PX: KNEE ARTHROSCOPY WITH MEDIAL MENISECTOMY: SHX5651

## 2023-12-11 LAB — POCT PREGNANCY, URINE: Preg Test, Ur: NEGATIVE

## 2023-12-11 SURGERY — ARTHROSCOPY, KNEE, WITH MEDIAL MENISCECTOMY
Anesthesia: General | Site: Knee | Laterality: Left

## 2023-12-11 MED ORDER — MIDAZOLAM HCL 2 MG/2ML IJ SOLN
INTRAMUSCULAR | Status: AC
  Filled 2023-12-11: qty 2

## 2023-12-11 MED ORDER — KETOROLAC TROMETHAMINE 30 MG/ML IJ SOLN
INTRAMUSCULAR | Status: AC
  Filled 2023-12-11: qty 1

## 2023-12-11 MED ORDER — ACETAMINOPHEN 500 MG PO TABS: 1000.0000 mg | ORAL_TABLET | Freq: Three times a day (TID) | ORAL | 0 refills | Status: AC

## 2023-12-11 MED ORDER — GABAPENTIN 300 MG PO CAPS
300.0000 mg | ORAL_CAPSULE | Freq: Once | ORAL | Status: AC
Start: 2023-12-11 — End: 2023-12-11
  Administered 2023-12-11: 300 mg via ORAL

## 2023-12-11 MED ORDER — BUPIVACAINE HCL (PF) 0.25 % IJ SOLN
INTRAMUSCULAR | Status: AC
  Filled 2023-12-11: qty 30

## 2023-12-11 MED ORDER — VANCOMYCIN HCL 1000 MG IV SOLR
INTRAVENOUS | Status: AC
  Filled 2023-12-11: qty 20

## 2023-12-11 MED ORDER — MEPERIDINE HCL 25 MG/ML IJ SOLN: 6.2500 mg | INTRAMUSCULAR | Status: DC | PRN

## 2023-12-11 MED ORDER — LACTATED RINGERS IV SOLN: INTRAVENOUS | Status: DC

## 2023-12-11 MED ORDER — PROPOFOL 10 MG/ML IV BOLUS
INTRAVENOUS | Status: AC
  Filled 2023-12-11: qty 20

## 2023-12-11 MED ORDER — HYDROMORPHONE HCL 1 MG/ML IJ SOLN: 0.2500 mg | INTRAMUSCULAR | Status: DC | PRN

## 2023-12-11 MED ORDER — PROPOFOL 10 MG/ML IV BOLUS
INTRAVENOUS | Status: DC | PRN
  Administered 2023-12-11: 160 mg via INTRAVENOUS

## 2023-12-11 MED ORDER — CEFAZOLIN SODIUM-DEXTROSE 2-4 GM/100ML-% IV SOLN
2.0000 g | INTRAVENOUS | Status: AC
  Administered 2023-12-11: 2 g via INTRAVENOUS

## 2023-12-11 MED ORDER — ACETAMINOPHEN 500 MG PO TABS
1000.0000 mg | ORAL_TABLET | Freq: Once | ORAL | Status: AC
  Administered 2023-12-11: 1000 mg via ORAL

## 2023-12-11 MED ORDER — MIDAZOLAM HCL 5 MG/5ML IJ SOLN
INTRAMUSCULAR | Status: DC | PRN
  Administered 2023-12-11: 2 mg via INTRAVENOUS

## 2023-12-11 MED ORDER — MELOXICAM 15 MG PO TABS: 15.0000 mg | ORAL_TABLET | Freq: Every day | ORAL | 0 refills | Status: DC

## 2023-12-11 MED ORDER — TRAMADOL HCL 50 MG PO TABS: 50.0000 mg | ORAL_TABLET | Freq: Four times a day (QID) | ORAL | 0 refills | Status: DC | PRN

## 2023-12-11 MED ORDER — CEFAZOLIN SODIUM-DEXTROSE 2-4 GM/100ML-% IV SOLN
INTRAVENOUS | Status: AC
  Filled 2023-12-11: qty 100

## 2023-12-11 MED ORDER — DEXAMETHASONE SODIUM PHOSPHATE 10 MG/ML IJ SOLN
INTRAMUSCULAR | Status: AC
  Filled 2023-12-11: qty 1

## 2023-12-11 MED ORDER — EPHEDRINE SULFATE (PRESSORS) 50 MG/ML IJ SOLN: INTRAMUSCULAR | Status: DC | PRN

## 2023-12-11 MED ORDER — DEXAMETHASONE SODIUM PHOSPHATE 10 MG/ML IJ SOLN
INTRAMUSCULAR | Status: DC | PRN
  Administered 2023-12-11: 10 mg via INTRAVENOUS

## 2023-12-11 MED ORDER — OXYCODONE HCL 5 MG PO TABS: 5.0000 mg | ORAL_TABLET | Freq: Once | ORAL | Status: DC | PRN

## 2023-12-11 MED ORDER — SODIUM CHLORIDE 0.9 % IV SOLN
12.5000 mg | INTRAVENOUS | Status: DC | PRN
  Filled 2023-12-11: qty 0.5

## 2023-12-11 MED ORDER — ASPIRIN 81 MG PO CHEW
81.0000 mg | CHEWABLE_TABLET | Freq: Two times a day (BID) | ORAL | 0 refills | Status: AC
Start: 2023-12-11 — End: 2024-01-22

## 2023-12-11 MED ORDER — ACETAMINOPHEN 500 MG PO TABS
ORAL_TABLET | ORAL | Status: AC
  Filled 2023-12-11: qty 2

## 2023-12-11 MED ORDER — ONDANSETRON HCL 4 MG PO TABS: 4.0000 mg | ORAL_TABLET | Freq: Three times a day (TID) | ORAL | 0 refills | Status: AC | PRN

## 2023-12-11 MED ORDER — LIDOCAINE HCL (CARDIAC) PF 100 MG/5ML IV SOSY
PREFILLED_SYRINGE | INTRAVENOUS | Status: DC | PRN
  Administered 2023-12-11: 100 mg via INTRAVENOUS

## 2023-12-11 MED ORDER — GABAPENTIN 300 MG PO CAPS
ORAL_CAPSULE | ORAL | Status: AC
  Filled 2023-12-11: qty 1

## 2023-12-11 MED ORDER — AMISULPRIDE (ANTIEMETIC) 5 MG/2ML IV SOLN: 10.0000 mg | Freq: Once | INTRAVENOUS | Status: DC | PRN

## 2023-12-11 MED ORDER — LIDOCAINE 2% (20 MG/ML) 5 ML SYRINGE
INTRAMUSCULAR | Status: AC
  Filled 2023-12-11: qty 5

## 2023-12-11 MED ORDER — ONDANSETRON HCL 4 MG/2ML IJ SOLN
INTRAMUSCULAR | Status: AC
  Filled 2023-12-11: qty 2

## 2023-12-11 MED ORDER — ONDANSETRON HCL 4 MG/2ML IJ SOLN
INTRAMUSCULAR | Status: DC | PRN
  Administered 2023-12-11: 4 mg via INTRAVENOUS

## 2023-12-11 MED ORDER — FENTANYL CITRATE (PF) 100 MCG/2ML IJ SOLN
INTRAMUSCULAR | Status: DC | PRN
  Administered 2023-12-11 (×2): 25 ug via INTRAVENOUS

## 2023-12-11 MED ORDER — BUPIVACAINE HCL (PF) 0.25 % IJ SOLN
INTRAMUSCULAR | Status: DC | PRN
Start: 2023-12-11 — End: 2023-12-11
  Administered 2023-12-11: 20 mL

## 2023-12-11 MED ORDER — OXYCODONE HCL 5 MG/5ML PO SOLN: 5.0000 mg | Freq: Once | ORAL | Status: DC | PRN

## 2023-12-11 MED ORDER — KETOROLAC TROMETHAMINE 30 MG/ML IJ SOLN
INTRAMUSCULAR | Status: DC | PRN
  Administered 2023-12-11: 30 mg via INTRAVENOUS

## 2023-12-11 MED ORDER — FENTANYL CITRATE (PF) 100 MCG/2ML IJ SOLN
INTRAMUSCULAR | Status: AC
Start: 2023-12-11 — End: 2023-12-11
  Filled 2023-12-11: qty 2

## 2023-12-11 SURGICAL SUPPLY — 35 items
BLADE SHAVER BONE 5.0X13 (MISCELLANEOUS) IMPLANT
BNDG COMPR ESMARK 6X3 LF (GAUZE/BANDAGES/DRESSINGS) IMPLANT
BNDG ELASTIC 6INX 5YD STR LF (GAUZE/BANDAGES/DRESSINGS) ×1 IMPLANT
BURR OVAL 8 FLU 4.0X13 (MISCELLANEOUS) IMPLANT
CHLORAPREP W/TINT 26 (MISCELLANEOUS) ×1 IMPLANT
CLSR STERI-STRIP ANTIMIC 1/2X4 (GAUZE/BANDAGES/DRESSINGS) ×1 IMPLANT
CUFF TRNQT CYL 34X4.125X (TOURNIQUET CUFF) ×1 IMPLANT
CUTTER KNOT PUSHER W/ SKID (INSTRUMENTS) IMPLANT
DISSECTOR 4.0MMX13CM CVD (MISCELLANEOUS) ×1 IMPLANT
DRAPE U-SHAPE 47X51 STRL (DRAPES) ×1 IMPLANT
DRAPE-T ARTHROSCOPY W/POUCH (DRAPES) ×1 IMPLANT
ELECTRODE REM PT RTRN 9FT ADLT (ELECTROSURGICAL) IMPLANT
FILTER NEPTUNE SMOKE EVACUATOR (MISCELLANEOUS) IMPLANT
GAUZE SPONGE 4X4 12PLY STRL (GAUZE/BANDAGES/DRESSINGS) ×1 IMPLANT
GLOVE BIO SURGEON STRL SZ 6.5 (GLOVE) ×1 IMPLANT
GLOVE BIOGEL PI IND STRL 6.5 (GLOVE) ×1 IMPLANT
GLOVE BIOGEL PI IND STRL 8 (GLOVE) ×1 IMPLANT
GLOVE ECLIPSE 8.0 STRL XLNG CF (GLOVE) ×2 IMPLANT
GOWN STRL REUS W/ TWL LRG LVL3 (GOWN DISPOSABLE) ×2 IMPLANT
GOWN STRL REUS W/TWL XL LVL3 (GOWN DISPOSABLE) ×1 IMPLANT
KIT TURNOVER KIT B (KITS) ×1 IMPLANT
MANIFOLD NEPTUNE II (INSTRUMENTS) IMPLANT
NS IRRIG 1000ML POUR BTL (IV SOLUTION) IMPLANT
PACK ARTHROSCOPY DSU (CUSTOM PROCEDURE TRAY) ×1 IMPLANT
PACK ICE MAXI GEL EZY WRAP (MISCELLANEOUS) IMPLANT
SLEEVE SCD COMPRESS KNEE MED (STOCKING) ×1 IMPLANT
SUT MNCRL AB 4-0 PS2 18 (SUTURE) ×1 IMPLANT
SUT PDS AB 0 CT 36 (SUTURE) IMPLANT
SUTURE 0 FIBERLP 38 BLU TPR ND (SUTURE) IMPLANT
TOWEL GREEN STERILE FF (TOWEL DISPOSABLE) ×2 IMPLANT
TUBE CONNECTING 20X1/4 (TUBING) ×1 IMPLANT
TUBING ARTHROSCOPY IRRIG 16FT (MISCELLANEOUS) ×1 IMPLANT
WAND ABLATOR APOLLO I90 (BUR) IMPLANT
WATER STERILE IRR 1000ML POUR (IV SOLUTION) ×1 IMPLANT
WRAP KNEE MAXI GEL POST OP (GAUZE/BANDAGES/DRESSINGS) IMPLANT

## 2023-12-11 NOTE — Op Note (Signed)
 Orthopaedic Surgery Operative Note (CSN: [id number])  Tiffany Henderson  Sep 16, 1975 Date of Surgery: 12/11/2023   Diagnoses:  medial meniscus tear of left knee  Procedure: Left knee medial meniscectomy   Operative Finding Lateral and PF compartment were normal, normal ligamentous exam.  Medial full thickness areas of cartilage loss, complex medial meniscus tear with 30% total meniscal volume resected.  Radial tear noted with complex component non repairable.  Unfortunately she may need medial compartment arthroplasty in the future.  Successful completion of the planned procedure.    Post-operative plan: The patient will be WBAT.  The patient will be discharged home.  DVT prophylaxis Aspirin  81 mg twice daily for 6 weeks.  Pain control with PRN pain medication preferring oral medicines.  Follow up plan will be scheduled in approximately 7 days for incision check.  Post-Op Diagnosis: Same Surgeons:Primary: Cristy Bonner DASEN, MD Assistants:Caroline McBane, PA-C Location: [facility] OR ROOM 1 Anesthesia: General with local Antibiotics: Ancef  2 g Tourniquet time:  Total Tourniquet Time Documented: Thigh (Left) - 17 minutes Total: Thigh (Left) - 17 minutes  Estimated Blood Loss: Minimal Complications: None Specimens: None Implants: * No implants in log *  Indications for Surgery:   Tiffany Henderson is a 48 y.o. female with meniscus tear and mechanical symptoms.  Benefits and risks of operative and nonoperative management were discussed prior to surgery with patient/guardian(s) and informed consent form was completed.  Specific risks including infection, need for additional surgery, postmeniscectomy syndrome, continued arthrosis and pain amongst others.   Procedure:   The patient was identified properly. Informed consent was obtained and the surgical site was marked. The patient was taken up to suite where general anesthesia was induced. The patient was placed in the supine position with a post  against the surgical leg and a nonsterile tourniquet applied. The surgical leg was then prepped and draped usual sterile fashion.  A standard surgical timeout was performed.  2 standard anterior portals were made and diagnostic arthroscopy performed. Please note the findings as noted above.  We used a shaver to perform a synovectomy of the anterior medial and anterolateral compartments as well as overgrowth along the patella.  We performed a medial meniscectomy using a shaver and basket back to a stable base removing all loose fragments.  Incisions closed with absorbable suture. The patient was awoken from general anesthesia and taken to the PACU in stable condition without complication.

## 2023-12-11 NOTE — Anesthesia Postprocedure Evaluation (Signed)
 Anesthesia Post Note  Patient: Tiffany Henderson  Procedure(s) Performed: ARTHROSCOPY, KNEE, WITH MEDIAL MENISCECTOMY (Left: Knee)     Patient location during evaluation: PACU Anesthesia Type: General Level of consciousness: awake and alert Pain management: pain level controlled Vital Signs Assessment: post-procedure vital signs reviewed and stable Respiratory status: spontaneous breathing, nonlabored ventilation and respiratory function stable Cardiovascular status: blood pressure returned to baseline and stable Postop Assessment: no apparent nausea or vomiting Anesthetic complications: no   No notable events documented.  Last Vitals:  Vitals:   12/11/23 0855 12/11/23 0922  BP:  (!) 142/94  Pulse: 63 (!) 52  Resp: 19 16  Temp:  (!) 36.3 C  SpO2: 98% 99%    Last Pain:  Vitals:   12/11/23 0922  TempSrc: Temporal  PainSc: 0-No pain                 Butler Levander Pinal

## 2023-12-11 NOTE — Anesthesia Procedure Notes (Signed)
 Procedure Name: LMA Insertion Date/Time: 12/11/2023 7:42 AM  Performed by: Debarah Chiquita LABOR, CRNAPre-anesthesia Checklist: Patient identified, Emergency Drugs available, Suction available and Patient being monitored Patient Re-evaluated:Patient Re-evaluated prior to induction Oxygen Delivery Method: Circle system utilized Preoxygenation: Pre-oxygenation with 100% oxygen Induction Type: IV induction Ventilation: Mask ventilation without difficulty LMA: LMA inserted LMA Size: 4.0 Number of attempts: 1 Airway Equipment and Method: Bite block Placement Confirmation: positive ETCO2 Tube secured with: Tape Dental Injury: Teeth and Oropharynx as per pre-operative assessment

## 2023-12-11 NOTE — Transfer of Care (Signed)
 Immediate Anesthesia Transfer of Care Note  Patient: Tiffany Henderson  Procedure(s) Performed: ARTHROSCOPY, KNEE, WITH MEDIAL MENISCECTOMY (Left: Knee)  Patient Location: PACU  Anesthesia Type:General  Level of Consciousness: drowsy  Airway & Oxygen Therapy: Patient Spontanous Breathing and Patient connected to face mask oxygen  Post-op Assessment: Report given to RN and Post -op Vital signs reviewed and stable  Post vital signs: Reviewed and stable  Last Vitals:  Vitals Value Taken Time  BP 104/70 12/11/23 08:20  Temp    Pulse 71 12/11/23 08:20  Resp 10 12/11/23 08:20  SpO2 100 % 12/11/23 08:20  Vitals shown include unfiled device data.  Last Pain:  Vitals:   12/11/23 0629  TempSrc: Tympanic  PainSc: 0-No pain      Patients Stated Pain Goal: 6 (12/11/23 9370)  Complications: No notable events documented.

## 2023-12-11 NOTE — Interval H&P Note (Signed)
 All questions answered, patient wants to proceed with procedure. ? ?

## 2023-12-11 NOTE — Anesthesia Preprocedure Evaluation (Addendum)
 Anesthesia Evaluation  Patient identified by MRN, date of birth, ID band Patient awake    Reviewed: Allergy & Precautions, H&P , NPO status , Patient's Chart, lab work & pertinent test results  Airway Mallampati: I  TM Distance: >3 FB Neck ROM: full    Dental no notable dental hx.    Pulmonary neg pulmonary ROS   Pulmonary exam normal breath sounds clear to auscultation       Cardiovascular negative cardio ROS Normal cardiovascular exam     Neuro/Psych  negative psych ROS   GI/Hepatic negative GI ROS, Neg liver ROS,,,  Endo/Other  negative endocrine ROS    Renal/GU negative Renal ROS  negative genitourinary   Musculoskeletal negative musculoskeletal ROS (+)    Abdominal Normal abdominal exam  (+)   Peds negative pediatric ROS (+)  Hematology negative hematology ROS (+)   Anesthesia Other Findings   Reproductive/Obstetrics                              Anesthesia Physical Anesthesia Plan  ASA: II  Anesthesia Plan: General   Post-op Pain Management: Tylenol  PO (pre-op)* and Gabapentin  PO (pre-op)*   Induction: Intravenous  PONV Risk Score and Plan: 3 and Ondansetron , Dexamethasone , Midazolam  and Treatment may vary due to age or medical condition  Airway Management Planned: LMA  Additional Equipment:   Intra-op Plan:   Post-operative Plan:   Informed Consent: I have reviewed the patients History and Physical, chart, labs and discussed the procedure including the risks, benefits and alternatives for the proposed anesthesia with the patient or authorized representative who has indicated his/her understanding and acceptance.       Plan Discussed with:   Anesthesia Plan Comments:          Anesthesia Quick Evaluation

## 2023-12-12 ENCOUNTER — Encounter (HOSPITAL_BASED_OUTPATIENT_CLINIC_OR_DEPARTMENT_OTHER): Payer: Self-pay | Admitting: Orthopaedic Surgery

## 2024-01-03 DIAGNOSIS — M545 Low back pain, unspecified: Secondary | ICD-10-CM | POA: Diagnosis not present

## 2024-01-03 DIAGNOSIS — N201 Calculus of ureter: Secondary | ICD-10-CM | POA: Diagnosis not present

## 2024-01-03 DIAGNOSIS — N23 Unspecified renal colic: Secondary | ICD-10-CM | POA: Diagnosis not present

## 2024-01-03 DIAGNOSIS — K5730 Diverticulosis of large intestine without perforation or abscess without bleeding: Secondary | ICD-10-CM | POA: Diagnosis not present

## 2024-01-03 DIAGNOSIS — R519 Headache, unspecified: Secondary | ICD-10-CM | POA: Diagnosis not present

## 2024-01-06 DIAGNOSIS — N201 Calculus of ureter: Secondary | ICD-10-CM | POA: Diagnosis not present

## 2024-01-06 DIAGNOSIS — N23 Unspecified renal colic: Secondary | ICD-10-CM | POA: Diagnosis not present

## 2024-01-07 DIAGNOSIS — N201 Calculus of ureter: Secondary | ICD-10-CM | POA: Diagnosis not present

## 2024-01-07 DIAGNOSIS — Z466 Encounter for fitting and adjustment of urinary device: Secondary | ICD-10-CM | POA: Diagnosis not present

## 2024-01-07 DIAGNOSIS — N200 Calculus of kidney: Secondary | ICD-10-CM | POA: Diagnosis not present

## 2024-01-07 DIAGNOSIS — N23 Unspecified renal colic: Secondary | ICD-10-CM | POA: Diagnosis not present

## 2024-01-15 DIAGNOSIS — N201 Calculus of ureter: Secondary | ICD-10-CM | POA: Diagnosis not present

## 2024-01-15 DIAGNOSIS — Z792 Long term (current) use of antibiotics: Secondary | ICD-10-CM | POA: Diagnosis not present

## 2024-02-19 ENCOUNTER — Other Ambulatory Visit: Payer: Self-pay | Admitting: Gastroenterology

## 2024-02-19 DIAGNOSIS — Z1231 Encounter for screening mammogram for malignant neoplasm of breast: Secondary | ICD-10-CM

## 2024-03-08 ENCOUNTER — Ambulatory Visit

## 2024-03-08 VITALS — Ht 67.5 in | Wt 170.0 lb

## 2024-03-08 DIAGNOSIS — Z800 Family history of malignant neoplasm of digestive organs: Secondary | ICD-10-CM

## 2024-03-08 MED ORDER — NA SULFATE-K SULFATE-MG SULF 17.5-3.13-1.6 GM/177ML PO SOLN: 1.0000 | Freq: Once | ORAL | 0 refills | Status: AC

## 2024-03-08 NOTE — Progress Notes (Signed)
 PCP MD at time of PV: NO PCP  __________________________________________________________________________________________________________________________________________  No egg allergy known to patient  No soy allergy known to patient No issues known to pt with past sedation with any surgeries or procedures Patient denies ever being told they had issues or difficulty with intubation  No FH of Malignant Hyperthermia Pt is not on diet pills Pt is not on  home 02  Pt is not on blood thinners  No A fib or A flutter Have any cardiac testing pending--no  LOA: independent  No Chew or Snuff tobacco __________________________________________________________________________________________________________________________________________  Constipation: no  Prep: suprep  __________________________________________________________________________________________________________________________________________  PV completed with patient. Prep instructions reviewed and provided during apt. Rx sent to preferred pharmacy.  __________________________________________________________________________________________________________________________________________  Patient's chart reviewed by Norleen Schillings CNRA prior to previsit and patient appropriate for the [facility].  Previsit completed and red dot placed by patient's name on their procedure day (on provider's schedule).

## 2024-03-15 ENCOUNTER — Encounter: Payer: Self-pay | Admitting: Gastroenterology

## 2024-03-17 ENCOUNTER — Encounter: Payer: Self-pay | Admitting: Gastroenterology

## 2024-03-22 ENCOUNTER — Encounter: Payer: Self-pay | Admitting: Gastroenterology

## 2024-03-22 ENCOUNTER — Ambulatory Visit: Admitting: Gastroenterology

## 2024-03-22 VITALS — BP 118/84 | HR 70 | Temp 97.7°F | Resp 11 | Ht 67.0 in | Wt 170.0 lb

## 2024-03-22 DIAGNOSIS — Z800 Family history of malignant neoplasm of digestive organs: Secondary | ICD-10-CM

## 2024-03-22 DIAGNOSIS — J45909 Unspecified asthma, uncomplicated: Secondary | ICD-10-CM | POA: Diagnosis not present

## 2024-03-22 DIAGNOSIS — K644 Residual hemorrhoidal skin tags: Secondary | ICD-10-CM | POA: Diagnosis not present

## 2024-03-22 DIAGNOSIS — Z1211 Encounter for screening for malignant neoplasm of colon: Secondary | ICD-10-CM | POA: Diagnosis not present

## 2024-03-22 DIAGNOSIS — K648 Other hemorrhoids: Secondary | ICD-10-CM | POA: Diagnosis not present

## 2024-03-22 DIAGNOSIS — D120 Benign neoplasm of cecum: Secondary | ICD-10-CM | POA: Diagnosis not present

## 2024-03-22 DIAGNOSIS — K5730 Diverticulosis of large intestine without perforation or abscess without bleeding: Secondary | ICD-10-CM | POA: Diagnosis not present

## 2024-03-22 MED ORDER — SODIUM CHLORIDE 0.9 % IV SOLN: 500.0000 mL | Freq: Once | INTRAVENOUS | Status: DC

## 2024-03-22 NOTE — Patient Instructions (Signed)
   Handouts on polyps,diverticulosis,& hemorrhoids given to you today.   Await pathology results on polyp removed      Continue previous diet & medications   YOU HAD AN ENDOSCOPIC PROCEDURE TODAY AT THE [facility]:   Refer to the procedure report that was given to you for any specific questions about what was found during the examination.  If the procedure report does not answer your questions, please call your gastroenterologist to clarify.  If you requested that your care partner not be given the details of your procedure findings, then the procedure report has been included in a sealed envelope for you to review at your convenience later.  YOU SHOULD EXPECT: Some feelings of bloating in the abdomen. Passage of more gas than usual.  Walking can help get rid of the air that was put into your GI tract during the procedure and reduce the bloating. If you had a lower endoscopy (such as a colonoscopy or flexible sigmoidoscopy) you may notice spotting of blood in your stool or on the toilet paper. If you underwent a bowel prep for your procedure, you may not have a normal bowel movement for a few days.  Please Note:  You might notice some irritation and congestion in your nose or some drainage.  This is from the oxygen used during your procedure.  There is no need for concern and it should clear up in a day or so.  SYMPTOMS TO REPORT IMMEDIATELY:  Following lower endoscopy (colonoscopy or flexible sigmoidoscopy):  Excessive amounts of blood in the stool  Significant tenderness or worsening of abdominal pains  Swelling of the abdomen that is new, acute  Fever of 100F or higher   For urgent or emergent issues, a gastroenterologist can be reached at any hour by calling [phone]. Do not use MyChart messaging for urgent concerns.    DIET:  We do recommend a small meal at first, but then you may proceed to your regular diet.  Drink plenty of fluids but you should avoid alcoholic  beverages for 24 hours.  ACTIVITY:  You should plan to take it easy for the rest of today and you should NOT DRIVE or use heavy machinery until tomorrow (because of the sedation medicines used during the test).    FOLLOW UP: Our staff will call the number listed on your records the next business day following your procedure.  We will call around 7:15- 8:00 am to check on you and address any questions or concerns that you may have regarding the information given to you following your procedure. If we do not reach you, we will leave a message.     If any biopsies were taken you will be contacted by phone or by letter within the next 1-3 weeks.  Please call us at [phone] if you have not heard about the biopsies in 3 weeks.    SIGNATURES/CONFIDENTIALITY: You and/or your care partner have signed paperwork which will be entered into your electronic medical record.  These signatures attest to the fact that that the information above on your After Visit Summary has been reviewed and is understood.  Full responsibility of the confidentiality of this discharge information lies with you and/or your care-partner.

## 2024-03-22 NOTE — Progress Notes (Signed)
 [facility] History and Physical   Primary Care Physician:  Patient, No Pcp Per   Reason for Procedure:  Family history of colon cancer  Plan:    Screening colonoscopy with possible interventions as needed     HPI: Tiffany Henderson is a very pleasant 48 y.o. female here for screening colonoscopy. Denies any nausea, vomiting, abdominal pain, melena or bright red blood per rectum  The risks and benefits as well as alternatives of endoscopic procedure(s) have been discussed and reviewed. The patient was provided an opportunity to ask questions and all were answered. The patient agreed with the plan and demonstrated an understanding of the instructions.   Past Medical History:  Diagnosis Date   Abnormal Pap smear    Asthma    History of shingles     Past Surgical History:  Procedure Laterality Date   COLPOSCOPY     DILATION AND CURETTAGE OF UTERUS     KNEE ARTHROSCOPY WITH MEDIAL MENISECTOMY Left 12/11/2023   Procedure: ARTHROSCOPY, KNEE, WITH MEDIAL MENISCECTOMY;  Surgeon: Cristy Bonner DASEN, MD;  Location: [facility];  Service: Orthopedics;  Laterality: Left;  Partial menisecetomy    Prior to Admission medications  Not on File    No current outpatient medications on file.   Current Facility-Administered Medications  Medication Dose Route Frequency Provider Last Rate Last Admin   0.9 %  sodium chloride  infusion  500 mL Intravenous Once Lavi Sheehan V, MD        Allergies as of 03/22/2024   (No Known Allergies)    Family History  Problem Relation Age of Onset   Hyperlipidemia Mother    Cancer Mother        breast '04 survivor   Hypertension Mother    Breast cancer Mother 23   Colon cancer Mother 65   Hypertension Father    Arthritis Father    Asthma Father    Hypertension Maternal Grandmother    Breast cancer Maternal Grandmother 26   Liver cancer Maternal Grandmother    Heart attack Maternal Grandfather    Birth defects Cousin         Down's   Colon polyps Neg Hx    Esophageal cancer Neg Hx    Rectal cancer Neg Hx    Stomach cancer Neg Hx     Social History   Socioeconomic History   Marital status: Married    Spouse name: Not on file   Number of children: 2   Years of education: Not on file   Highest education level: Not on file  Occupational History   Not on file  Tobacco Use   Smoking status: Never   Smokeless tobacco: Never  Vaping Use   Vaping status: Never Used  Substance and Sexual Activity   Alcohol use: No   Drug use: No   Sexual activity: Yes    Birth control/protection: None  Other Topics Concern   Not on file  Social History Narrative   [employer]- 4 years/divorced; married '10Work Gastro registrarNo abuse history   Highest level education - college   Social Drivers of Health   Tobacco Use: Low Risk (03/22/2024)   Patient History    Smoking Tobacco Use: Never    Smokeless Tobacco Use: Never    Passive Exposure: Not on file  Financial Resource Strain: Not on file  Food Insecurity: Not on file  Transportation Needs: Not on file  Physical Activity: Not on file  Stress: Not on  file  Social Connections: Not on file  Intimate Partner Violence: Not on file  Depression [id number]): Not on file  Alcohol Screen: Not on file  Housing: Not on file  Utilities: Not on file  Health Literacy: Not on file    Review of Systems:  All other review of systems negative except as mentioned in the HPI.  Physical Exam: Vital signs in last 24 hours: BP 131/89   Pulse 79   Temp 97.7 F (36.5 C)   Resp 13   Ht 5' 7 (1.702 m)   Wt 170 lb (77.1 kg)   SpO2 100%   BMI 26.63 kg/m  General:   Alert, NAD Lungs:  Clear .   Heart:  Regular rate and rhythm Abdomen:  Soft, nontender and nondistended. Neuro/Psych:  Alert and cooperative. Normal mood and affect. A and O x 3  Reviewed labs, radiology imaging, old records and pertinent past GI work up  Patient is appropriate  for planned procedure(s) and anesthesia in an ambulatory setting   K. Veena Tramond Slinker , MD [phone]

## 2024-03-22 NOTE — Progress Notes (Signed)
 Report given to PACU, vss

## 2024-03-22 NOTE — Progress Notes (Signed)
 Called to room to assist during endoscopic procedure.  Patient ID and intended procedure confirmed with present staff. Received instructions for my participation in the procedure from the performing physician.

## 2024-03-22 NOTE — Op Note (Signed)
 [facility] Patient Name: Tiffany Henderson Procedure Date: 03/22/2024 8:08 AM MRN: [id number] Endoscopist: Gustav ALONSO Mcgee , MD, [id number] Age: 48 Referring MD:  Date of Birth: 07-Apr-1976 Gender: Female Account #: [id number] Procedure:                Colonoscopy Indications:              Screening in patient at increased risk: Colorectal                            cancer in mother before age 90 Medicines:                Monitored Anesthesia Care Procedure:                Pre-Anesthesia Assessment:                           - Prior to the procedure, a History and Physical                            was performed, and patient medications and                            allergies were reviewed. The patient's tolerance of                            previous anesthesia was also reviewed. The risks                            and benefits of the procedure and the sedation                            options and risks were discussed with the patient.                            All questions were answered, and informed consent                            was obtained. Prior Anticoagulants: The patient has                            taken no anticoagulant or antiplatelet agents. ASA                            Grade Assessment: II - A patient with mild systemic                            disease. After reviewing the risks and benefits,                            the patient was deemed in satisfactory condition to                            undergo the procedure.  After obtaining informed consent, the colonoscope                            was passed under direct vision. Throughout the                            procedure, the patient's blood pressure, pulse, and                            oxygen saturations were monitored continuously. The                            Olympus Scope [id number] was introduced through the                            anus and advanced  to the the terminal ileum, with                            identification of the appendiceal orifice and IC                            valve. The colonoscopy was performed without                            difficulty. The patient tolerated the procedure                            well. The quality of the bowel preparation was                            adequate. The ileocecal valve, appendiceal orifice,                            and rectum were photographed. Scope In: 8:21:40 AM Scope Out: 8:33:09 AM Scope Withdrawal Time: 0 hours 7 minutes 21 seconds  Total Procedure Duration: 0 hours 11 minutes 29 seconds  Findings:                 The perianal and digital rectal examinations were                            normal.                           A 1 mm polyp was found in the cecum. The polyp was                            sessile. The polyp was removed with a cold biopsy                            forceps. Resection and retrieval were complete.                           Scattered small-mouthed diverticula were found in  the sigmoid colon, transverse colon and ascending                            colon.                           Non-bleeding external and internal hemorrhoids were                            found during retroflexion. The hemorrhoids were                            medium-sized. Complications:            No immediate complications. Estimated Blood Loss:     Estimated blood loss was minimal. Impression:               - One 1 mm polyp in the cecum, removed with a cold                            biopsy forceps. Resected and retrieved.                           - Diverticulosis in the sigmoid colon, in the                            transverse colon and in the ascending colon.                           - Non-bleeding external and internal hemorrhoids. Recommendation:           - Resume previous diet.                           - Continue present  medications.                           - Await pathology results.                           - Repeat colonoscopy in 5 years for surveillance. Tiffany Henderson V. Tiffany Doddridge, MD 03/22/2024 8:41:50 AM This report has been signed electronically.

## 2024-03-22 NOTE — Progress Notes (Signed)
 Pt's states no medical or surgical changes since previsit or office visit.

## 2024-03-23 ENCOUNTER — Telehealth: Payer: Self-pay

## 2024-03-23 NOTE — Telephone Encounter (Signed)
°  Follow up Call-     03/22/2024    7:10 AM  Call back number  Post procedure Call Back phone  # [phone]  Permission to leave phone message Yes     Patient questions:  Do you have a fever, pain , or abdominal swelling? No. Pain Score  0 *  Have you tolerated food without any problems? Yes.    Have you been able to return to your normal activities? Yes.    Do you have any questions about your discharge instructions: Diet   No. Medications  No. Follow up visit  No.  Do you have questions or concerns about your Care? No.  Actions: * If pain score is 4 or above: No action needed, pain <4.

## 2024-03-25 LAB — SURGICAL PATHOLOGY

## 2024-04-05 ENCOUNTER — Ambulatory Visit
Admission: RE | Admit: 2024-04-05 | Discharge: 2024-04-05 | Disposition: A | Source: Ambulatory Visit | Attending: Gastroenterology

## 2024-04-05 DIAGNOSIS — Z1231 Encounter for screening mammogram for malignant neoplasm of breast: Secondary | ICD-10-CM

## 2024-04-13 ENCOUNTER — Telehealth

## 2024-04-13 DIAGNOSIS — R3989 Other symptoms and signs involving the genitourinary system: Secondary | ICD-10-CM

## 2024-04-14 ENCOUNTER — Ambulatory Visit: Payer: Self-pay | Admitting: Gastroenterology

## 2024-04-14 MED ORDER — CEPHALEXIN 500 MG PO CAPS: 500.0000 mg | ORAL_CAPSULE | Freq: Two times a day (BID) | ORAL | 0 refills | Status: AC

## 2024-04-14 NOTE — Progress Notes (Signed)

## 2024-04-19 ENCOUNTER — Encounter: Payer: Self-pay | Admitting: *Deleted

## 2024-05-06 ENCOUNTER — Ambulatory Visit: Payer: Self-pay | Admitting: Gastroenterology
